# Patient Record
Sex: Female | Born: 1985 | Race: White | Hispanic: No | Marital: Married | Smoking: Never smoker
Health system: Southern US, Community
[De-identification: ages and names within clinical notes are randomized; demographics above are authoritative.]

## PROBLEM LIST (undated history)

## (undated) DIAGNOSIS — J45909 Unspecified asthma, uncomplicated: Secondary | ICD-10-CM

## (undated) HISTORY — DX: Unspecified asthma, uncomplicated: J45.909

---

## 2009-08-03 ENCOUNTER — Ambulatory Visit: Payer: Self-pay | Admitting: Internal Medicine

## 2009-09-06 ENCOUNTER — Ambulatory Visit: Payer: Self-pay | Admitting: Family Medicine

## 2009-09-10 ENCOUNTER — Inpatient Hospital Stay: Payer: Self-pay | Admitting: Internal Medicine

## 2009-10-16 ENCOUNTER — Ambulatory Visit: Payer: Self-pay | Admitting: Internal Medicine

## 2010-04-25 IMAGING — CT CT CHEST W/ CM
1 of 2 series · 14 of 32 positions shown, 18 images · IV contrast (isovue)
Comparison: none

REASON FOR EXAM: shortnes  of breath with  O2 stat 92
COMMENTS:   LMP: negative preg in ER

PROCEDURE:     CT  - CT CHEST (FOR PE) W  - September 10, 2009  [DATE]
RESULT:     Chest CT dated 09/10/2009.
TECHNIQUE: Helical 3 mm sections were obtained from the thoracic inlet through the lung
bases status post intravenous administration of 75 mL Isovue 370.

[Series 5: lung windows · axial · 0.62mm/px · z∈[-308,-44]mm · 14 of 106 slices shown, 18 images]
[im 9/106  mediastinal]
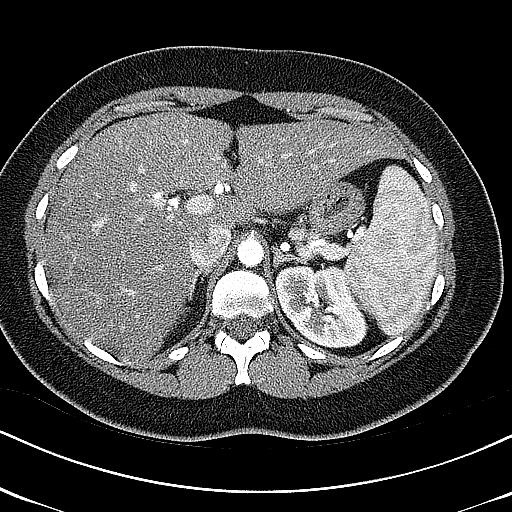
[im 9/106  lung]
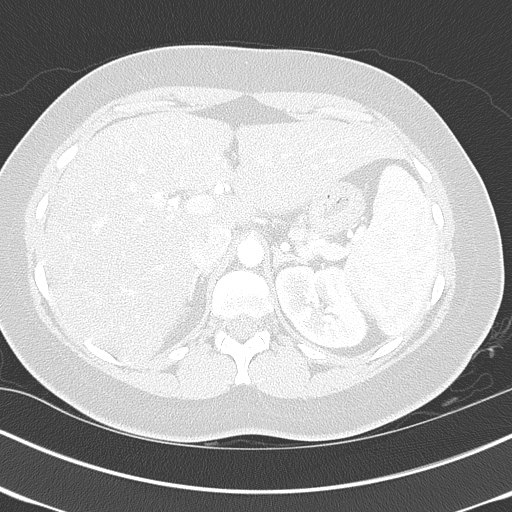
[im 17/106  lung]
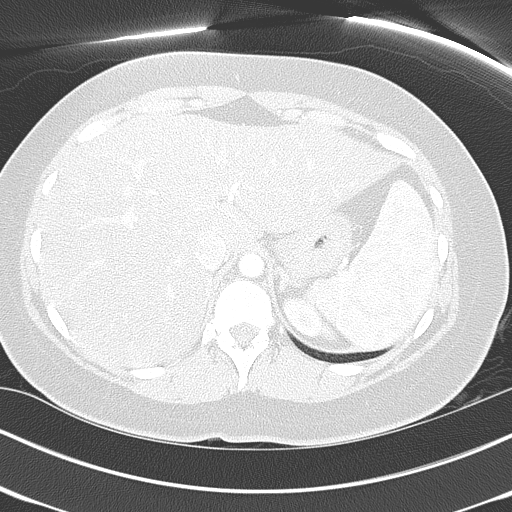
[im 25/106  lung]
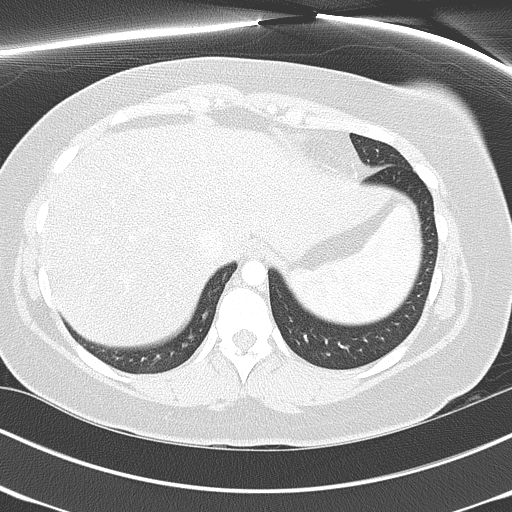
[im 33/106  lung]
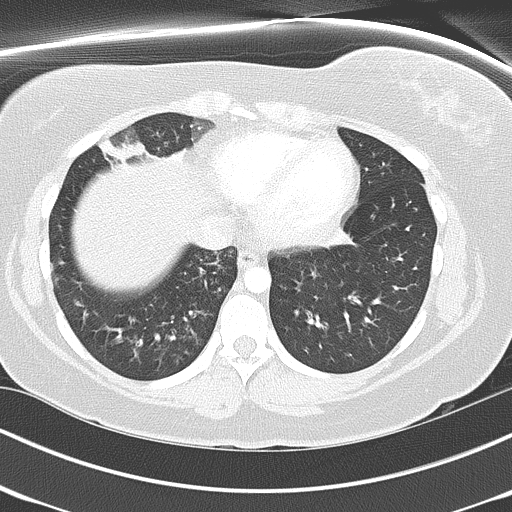
[im 41/106  mediastinal]
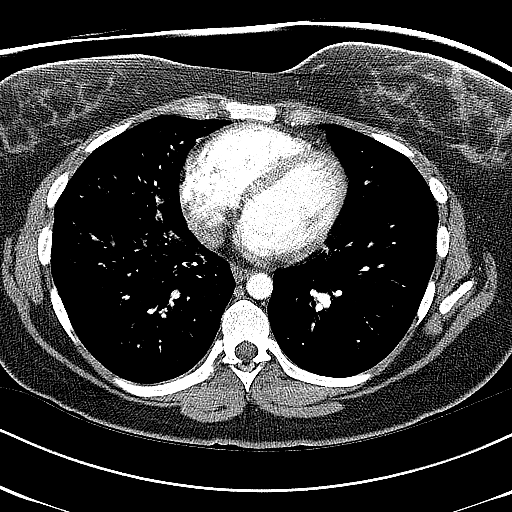
[im 41/106  lung]
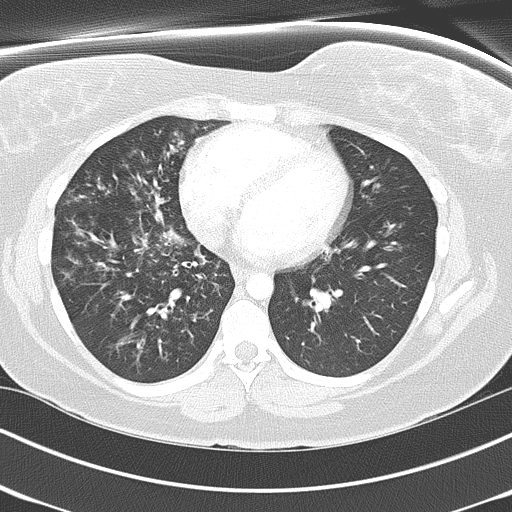
[im 49/106  lung]
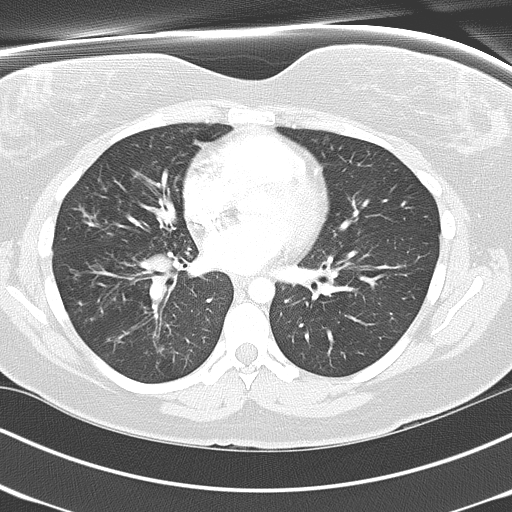
[im 50/106  lung]
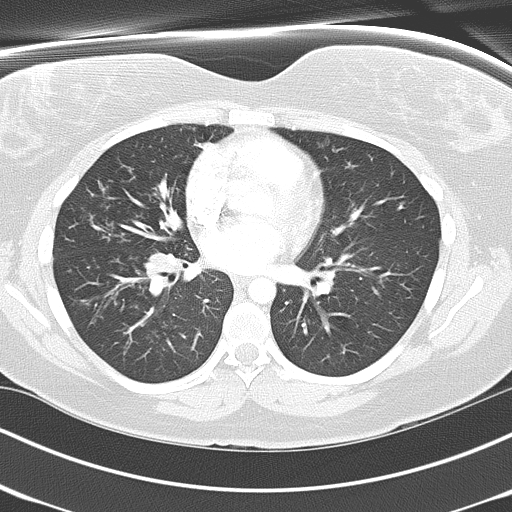
[im 53/106  lung]
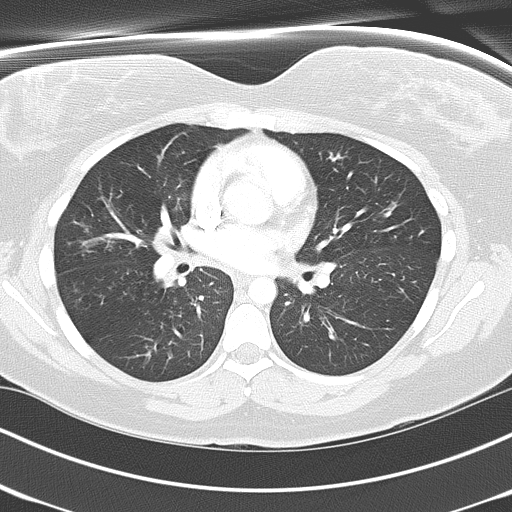
[im 57/106  mediastinal]
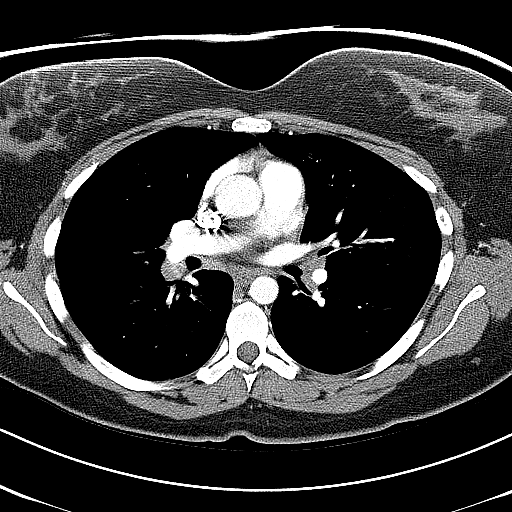
[im 57/106  lung]
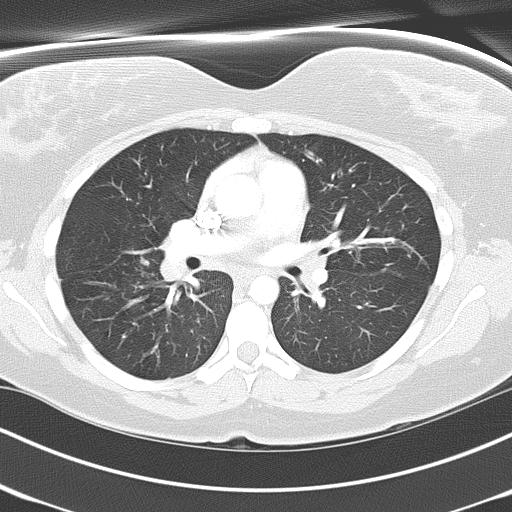
[im 65/106  lung]
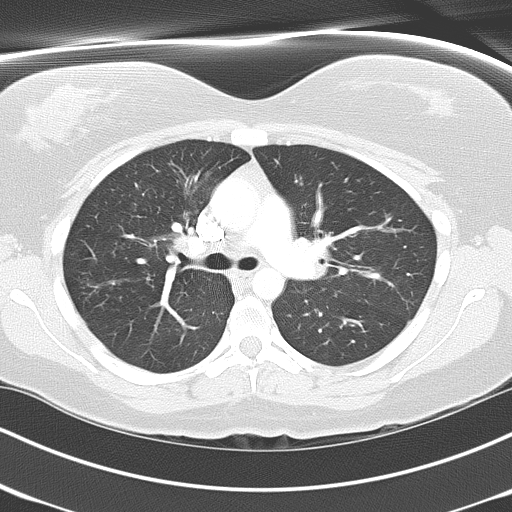
[im 73/106  lung]
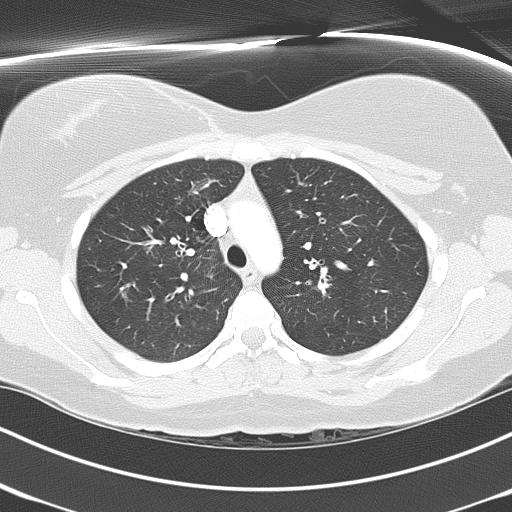
[im 81/106  lung]
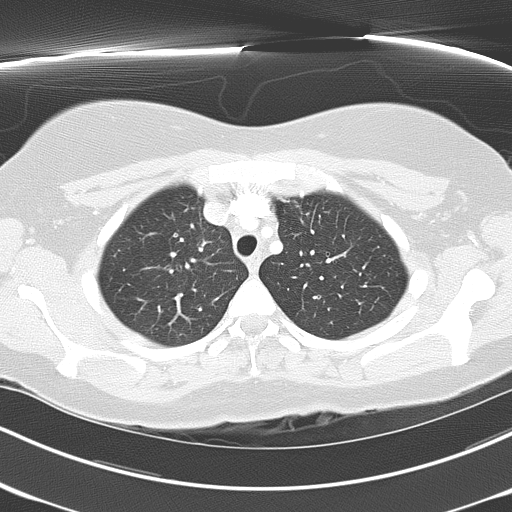
[im 89/106  mediastinal]
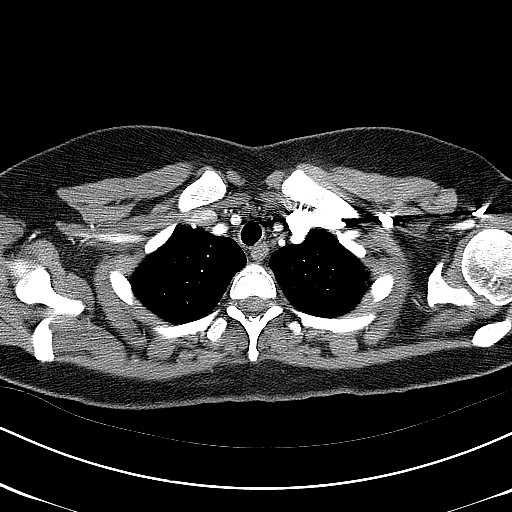
[im 89/106  lung]
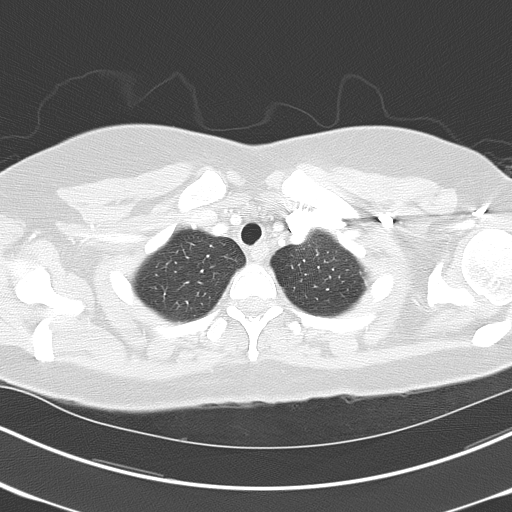
[im 97/106  lung]
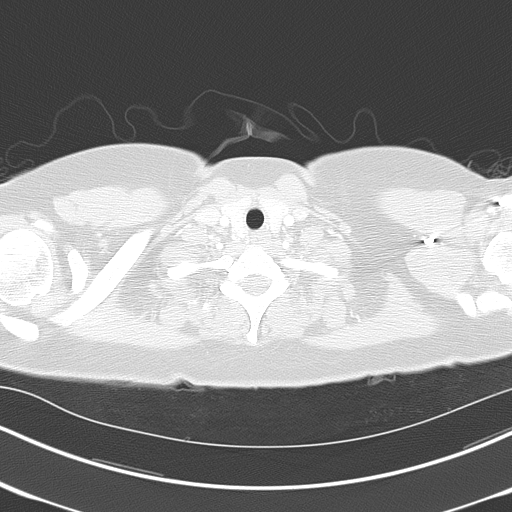

[14 of 32 positions shown; findings below may reference images not displayed]

FINDINGS: Evaluation of the mediastinum demonstrates no evidence of mediastinal
adenopathy nor masses. Bilateral enlarged hilar lymph nodes are identified.
Measuring approximately 1 cm. Is no evidence of hilar masses. Is no evidence
of filling defects within the main lobar or segmental pulmonary arteries.
Evaluation of the lung parenchyma demonstrates patchy areas of multifocal
pulmonary opacities primarily within the right hemithorax. These areas
demonstrated primarily interstitial distribution. A more consolidative
nodular component projects within the anterior base of the right middle
lobe. The appears of the primarily sparing within the left hemithorax
without focal regions of consolidation nor appreciable focal infiltrates.
Evaluation of the visualized upper abdominal viscera demonstrates a focus of
low-attenuation only partially visualized in the region of the porta hepatis
liker attending partial visualization of the gallbladder with averaging of
the overlying liver parenchyma. The remaining visualized upper abdominal
viscera otherwise grossly unremarkable.

There is no evidence of pleural effusions.
IMPRESSION: Primarily interstitial infiltrate involving the right
hemithorax with greatest confluence in the right lower lobe and base of the
middle lobes. There is a minimal alveolar component. Consolidative nodular
components also identified within the base the right middle lobe. Prominent
hilar lymph nodes are identified likely reactive. Different considerations
are infectious infiltrate redoing clinically appropriate. Inflammatory
etiologies cannot be excluded nor of lower different considerations. Ominous
etiologies are of little to no differential consideration though if persist
status post appropriate there acute regiment may be of differential
consideration. Surveillance CT and/or plain film radiograph is recommended
status post appropriate therapeutic regiment.
2. Dr. Tadasa of the emergency department was informed of these findings at
the time of initial interpretation.

## 2010-05-31 IMAGING — CR DG CHEST 2V
1 series · 2 of 2 positions shown · non-contrast
Comparison: none

REASON FOR EXAM: sob cough
COMMENTS:

PROCEDURE:     DXR - DXR CHEST PA (OR AP) AND LATERAL  - October 16, 2009  [DATE]
RESULT:     The previously noted right lower lobe infiltrate has  now
cleared. No new pulmonary infiltrates are identified. Heart size is normal.
Mediastinal and osseous structures are normal in appearance.

[Series 1: view not recorded · 0.17mm/px · 2 of 2 slices shown]
[im 1/2]
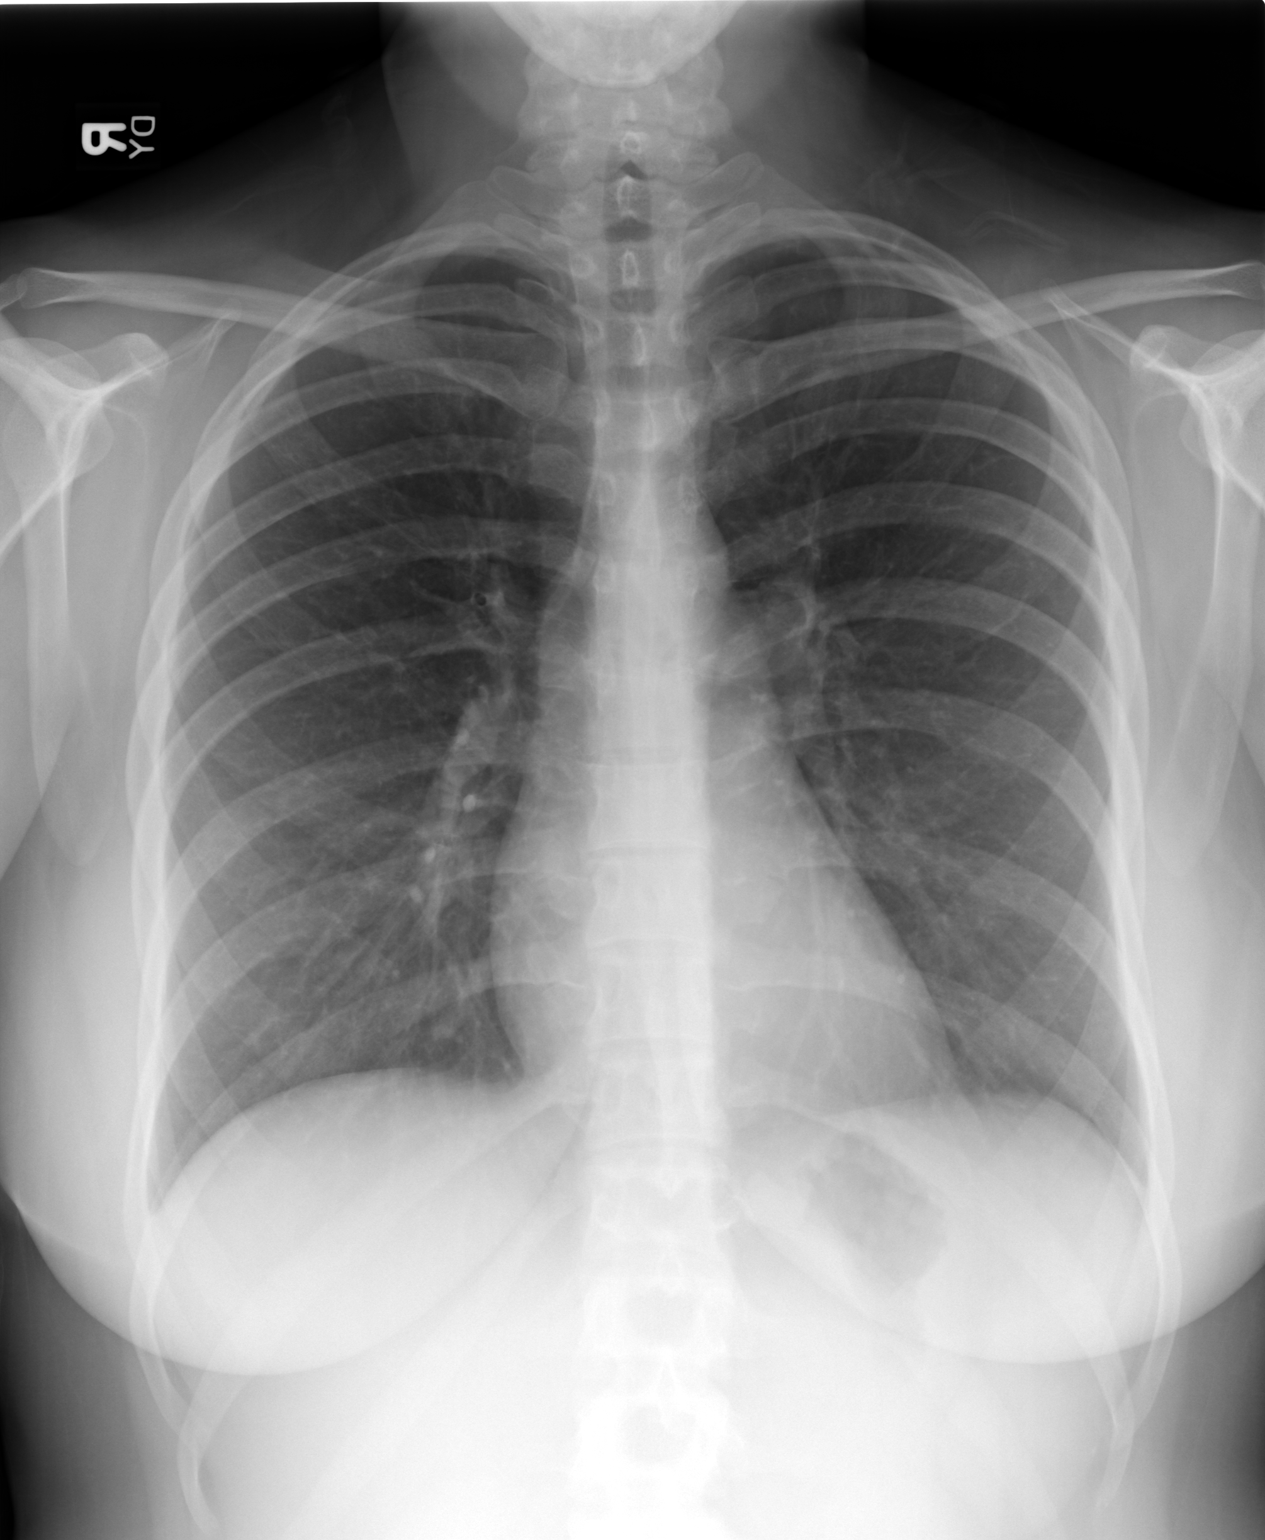
[im 2/2]
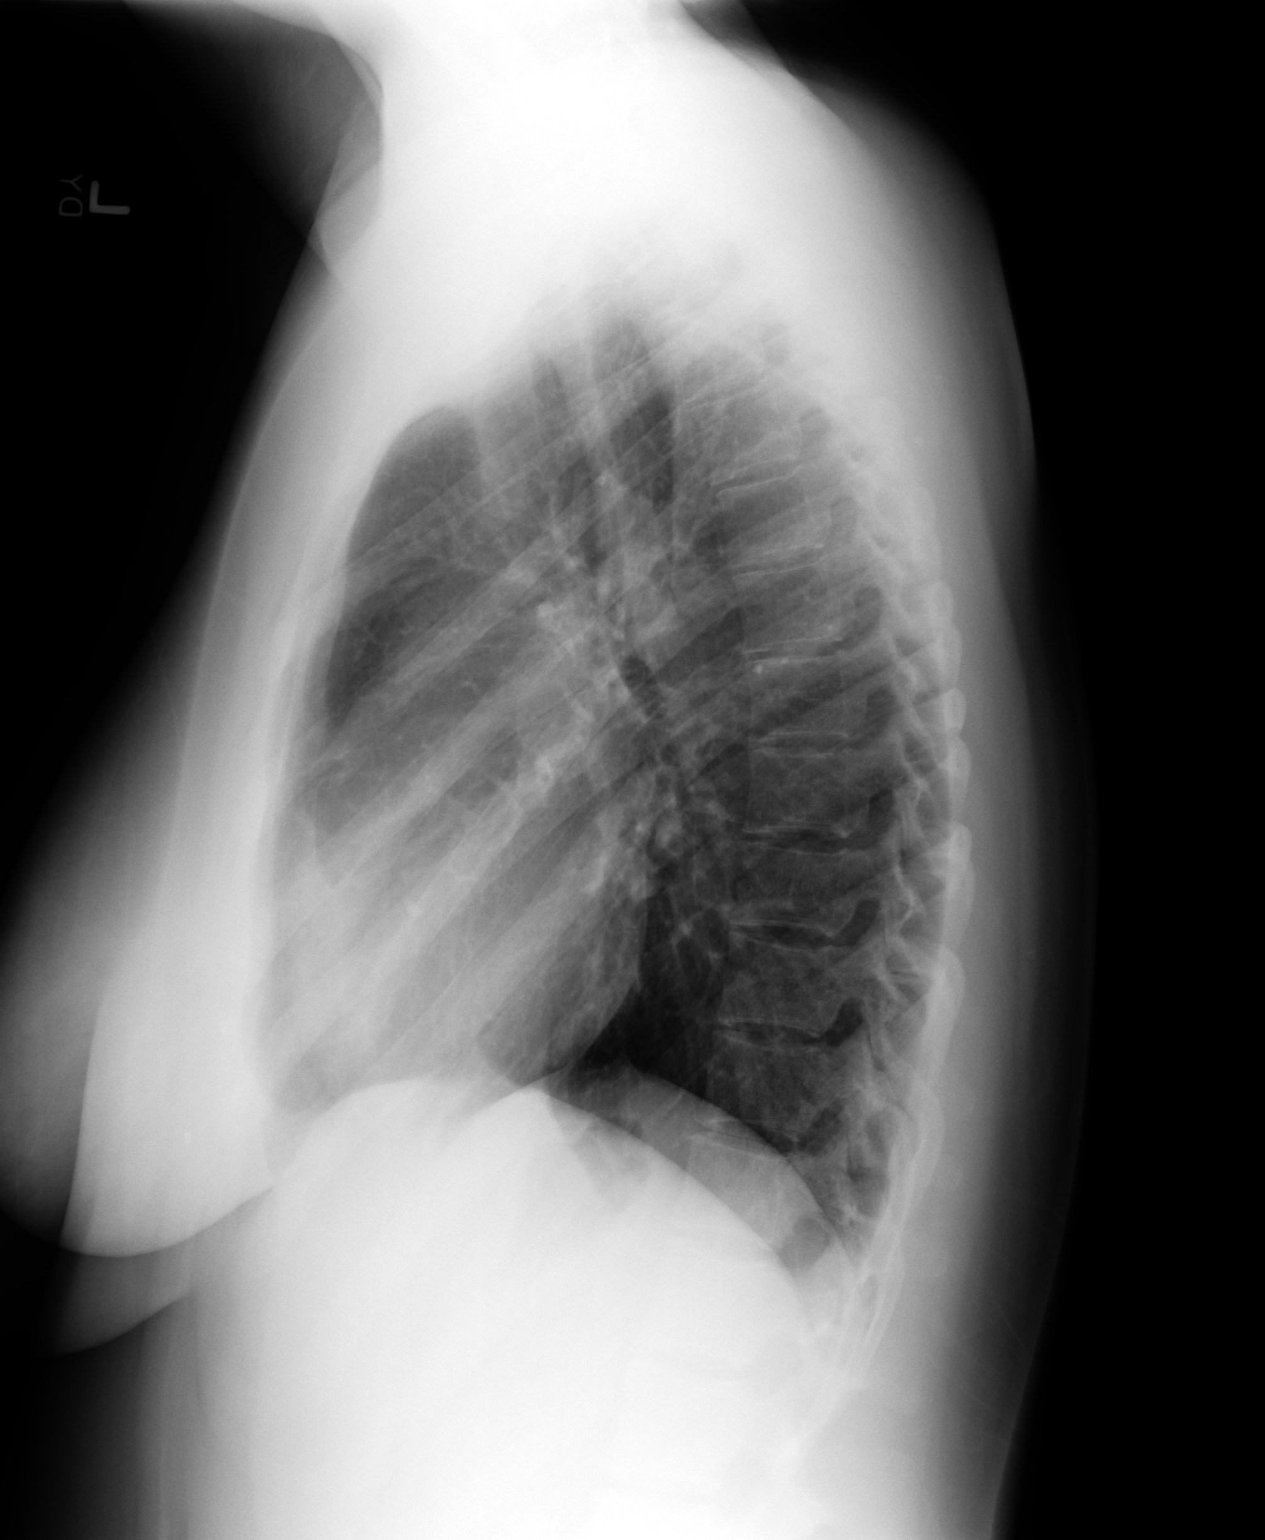

[2 of 2 positions shown; findings below may reference images not displayed]

IMPRESSION: 1.     The lung fields are clear.
2.     The previously noted infiltrate medially in the right lower lobe has
resolved. No new infiltrates are seen.

## 2011-10-03 ENCOUNTER — Ambulatory Visit: Payer: Self-pay

## 2016-07-14 ENCOUNTER — Ambulatory Visit
Admission: EM | Admit: 2016-07-14 | Discharge: 2016-07-14 | Disposition: A | Discharge status: Home / Self Care | Payer: Self-pay | Attending: Emergency Medicine | Admitting: Emergency Medicine

## 2016-07-14 DIAGNOSIS — J101 Influenza due to other identified influenza virus with other respiratory manifestations: Secondary | ICD-10-CM

## 2016-07-14 HISTORY — DX: Unspecified asthma, uncomplicated: J45.909

## 2016-07-14 LAB — RAPID INFLUENZA A&B ANTIGENS (ARMC ONLY): INFLUENZA A (ARMC): POSITIVE — AB

## 2016-07-14 LAB — RAPID INFLUENZA A&B ANTIGENS: Influenza B (ARMC): NEGATIVE

## 2016-07-14 MED ORDER — FLUTICASONE PROPIONATE 50 MCG/ACT NA SUSP: 2.0000 | Freq: Every day | NASAL | 0 refills | Status: DC

## 2016-07-14 MED ORDER — AEROCHAMBER PLUS MISC: 2 refills | Status: DC

## 2016-07-14 MED ORDER — OSELTAMIVIR PHOSPHATE 75 MG PO CAPS: 75.0000 mg | ORAL_CAPSULE | Freq: Two times a day (BID) | ORAL | 0 refills | Status: DC

## 2016-07-14 NOTE — ED Provider Notes (Signed)
HPI  SUBJECTIVE:  Kimberly Valdez is a 31 y.o. female who presents with 2 days of bodyaches for headaches, clear rhinorrhea, and postnasal drip, scratchy sore throat and a cough productive of white phlegm. She reports fevers Tmax 102.6. She reports sinus pain and pressure, decreased appetite but is tolerating by mouth. She tried TheraFlu and ibuprofen 800 mg which helped her symptoms. No aggravating factors. She denies wheezing, chest pain, short of breath. No dental pain, facial swelling. No photophobia, neck stiffness, rash. No abdominal pain, urinary complaint, diarrhea. No skin infections. She did not get a flu shot this year. No known contacts with flu. Sates that she is sleeping well at night. She has a past medical history of asthma for which she has an inhaler. No history of diabetes, hypertension, immunosuppression. LMP: 1.5 weeks ago. Denies possibility of being pregnant. PMD: Dr. Welton Flakes    Past Medical History:  Diagnosis Date  . RAD (reactive airway disease)     History reviewed. No pertinent surgical history.  History reviewed. No pertinent family history.  Social History  Substance Use Topics  . Smoking status: Never Smoker  . Smokeless tobacco: Never Used  . Alcohol use Yes     Comment: rare    No current facility-administered medications for this encounter.   Current Outpatient Prescriptions:  .  albuterol (PROVENTIL HFA;VENTOLIN HFA) 108 (90 Base) MCG/ACT inhaler, Inhale into the lungs every 6 (six) hours as needed for wheezing or shortness of breath., Disp: , Rfl:  .  fluticasone (FLONASE) 50 MCG/ACT nasal spray, Place 2 sprays into both nostrils daily., Disp: 16 g, Rfl: 0 .  oseltamivir (TAMIFLU) 75 MG capsule, Take 1 capsule (75 mg total) by mouth 2 (two) times daily. X 5 days, Disp: 10 capsule, Rfl: 0 .  Spacer/Aero-Holding Chambers (AEROCHAMBER PLUS) inhaler, Use as instructed, Disp: 1 each, Rfl: 2  No Known Allergies   ROS  As noted in HPI.   Physical  Exam  BP 118/82 (BP Location: Left Arm)   Pulse 86   Temp 98.8 F (37.1 C) (Oral)   Resp 20   Ht 5\' 4"  (1.626 m)   Wt 221 lb (100.2 kg)   LMP 07/05/2016   SpO2 97%   BMI 37.93 kg/m   Constitutional: Well developed, well nourished, no acute distress Eyes: PERRL, EOMI, conjunctiva normal bilaterally HENT: Normocephalic, atraumatic,mucus membranes moist. + clear nasal congestion, erythematous, swollen turbinates. No sinus tenderness. Normal tonsils, uvula midline. Unable to visualize oropharynx completely. Neck: No cervical lymphadenopathy, meningismus  Respiratory: Clear to auscultation bilaterally, no rales, no wheezing, no rhonchi Cardiovascular: Normal rate and rhythm, no murmurs, no gallops, no rubs GI: Soft, nondistended, normal bowel sounds, nontender, no rebound, no guarding Back: no CVAT skin: No rash, skin intact Musculoskeletal: No edema, no tenderness, no deformities Neurologic: Alert & oriented x 3, CN II-XII grossly intact, no motor deficits, sensation grossly intact Psychiatric: Speech and behavior appropriate   ED Course   Medications - No data to display  Orders Placed This Encounter  Procedures  . Rapid Influenza A&B Antigens ([facility] only)    Standing Status:   Standing    Number of Occurrences:   1  . Droplet precaution    Standing Status:   Standing    Number of Occurrences:   1   Results for orders placed or performed during the hospital encounter of 07/14/16 (from the past 24 hour(s))  Rapid Influenza A&B Antigens ([facility] only)     Status: Abnormal  Collection Time: 07/14/16 11:43 AM  Result Value Ref Range   Influenza A ([facility]) POSITIVE (A) NEGATIVE   Influenza B ([facility]) NEGATIVE NEGATIVE   No results found.  ED Clinical Impression  Influenza A   ED Assessment/Plan  She influenza A positive. Plan to send home with Tamiflu, ibuprofen 800 mg 1 g of Tylenol 3 times a day Patient states that she is plenty of ibuprofen 800 mg at home.. Advised  Flonase, saline nasal irrigation, Mucinex D, discontinue all other cold medicines. We'll also prescribe a spacer for her albuterol inhaler she states that she does not have one. Push electrolyte containing fluids. Work note for 2 days. Follow-up with primary care physician as needed, to the ER if gets worse   Discussed labs, MDM, plan and followup with patient  Discussed sn/sx that should prompt return to the ED. Patient  agrees with plan.   Meds ordered this encounter  Medications  . albuterol (PROVENTIL HFA;VENTOLIN HFA) 108 (90 Base) MCG/ACT inhaler    Sig: Inhale into the lungs every 6 (six) hours as needed for wheezing or shortness of breath.  . fluticasone (FLONASE) 50 MCG/ACT nasal spray    Sig: Place 2 sprays into both nostrils daily.    Dispense:  16 g    Refill:  0  . Spacer/Aero-Holding Chambers (AEROCHAMBER PLUS) inhaler    Sig: Use as instructed    Dispense:  1 each    Refill:  2  . oseltamivir (TAMIFLU) 75 MG capsule    Sig: Take 1 capsule (75 mg total) by mouth 2 (two) times daily. X 5 days    Dispense:  10 capsule    Refill:  0    *This clinic note was created using Scientist, clinical (histocompatibility and immunogenetics)Dragon dictation software. Therefore, there may be occasional mistakes despite careful proofreading.  ?   Domenick GongAshley Johnnell Liou, MD 07/14/16 [phone]

## 2016-07-14 NOTE — Discharge Instructions (Signed)
ibuprofen 800 mg 1 g of Tylenol 3 times a day  Flonase, saline nasal irrigation, Mucinex D 1200 mg of guaifenesin with 120 mg pseudoephedrine, discontinue all other cold medicines. We'll also prescribe a spacer for her albuterol inhaler she states that she does not have one. Push electrolyte containing fluids.

## 2016-07-14 NOTE — ED Triage Notes (Signed)
Pt with fever 102, cough, bodyaches since Friday night.

## 2017-01-27 DIAGNOSIS — G43909 Migraine, unspecified, not intractable, without status migrainosus: Secondary | ICD-10-CM | POA: Insufficient documentation

## 2017-01-27 LAB — HM PAP SMEAR: HM Pap smear: NEGATIVE

## 2018-09-17 ENCOUNTER — Ambulatory Visit: Payer: Self-pay

## 2018-09-22 ENCOUNTER — Ambulatory Visit: Payer: Self-pay | Admitting: Gerontology

## 2018-09-23 ENCOUNTER — Encounter: Payer: Self-pay | Admitting: Gerontology

## 2018-09-23 ENCOUNTER — Ambulatory Visit: Payer: Self-pay | Admitting: Gerontology

## 2018-09-23 ENCOUNTER — Encounter (INDEPENDENT_AMBULATORY_CARE_PROVIDER_SITE_OTHER): Payer: Self-pay

## 2018-09-23 ENCOUNTER — Other Ambulatory Visit: Payer: Self-pay

## 2018-09-23 VITALS — BP 132/96 | HR 82 | Ht 64.0 in | Wt 236.3 lb

## 2018-09-23 DIAGNOSIS — Z7689 Persons encountering health services in other specified circumstances: Secondary | ICD-10-CM

## 2018-09-23 DIAGNOSIS — Z8709 Personal history of other diseases of the respiratory system: Secondary | ICD-10-CM

## 2018-09-23 DIAGNOSIS — Z8669 Personal history of other diseases of the nervous system and sense organs: Secondary | ICD-10-CM

## 2018-09-23 MED ORDER — MOMETASONE FURO-FORMOTEROL FUM 100-5 MCG/ACT IN AERO: 2.0000 | INHALATION_SPRAY | Freq: Two times a day (BID) | RESPIRATORY_TRACT | 2 refills | Status: DC

## 2018-09-23 MED ORDER — ALBUTEROL SULFATE HFA 108 (90 BASE) MCG/ACT IN AERS: 2.0000 | INHALATION_SPRAY | Freq: Four times a day (QID) | RESPIRATORY_TRACT | 3 refills | Status: DC | PRN

## 2018-09-23 NOTE — Progress Notes (Signed)
Patient ID: NOELA BROTHERS, female   DOB: 1986/04/19, 32 y.o.   MRN: [id number]  No chief complaint on file.   HPI OLUWATOSIN BRACY is a 33 y.o. female. Presents to establish care and evaluation of Asthma and Migraine Asthma;  She states that she has Asthma which flares up during the allergy season and cold weather. She reports that last month she had URI and was prescribed Albuterol and prednisone taper, and information was not found. She states that she takes otc 10 mg claritine daily. She reports  that in the past week, she has being using her Albuterol inhaler 1 puff every 2-3 hours because she was experiencing intermittent chest tightness, wheezing, and shortness of breath many times during the day. She denies cough, fever, chills,chest pain and palpitation.  Migraine: She states that she experiences mild intermittent migraine headache to right temporal side of her head that starts 3 days prior to her menstrual cycle , and taking 200 mg of Excedrine relieves headache. She reports that her migraine usually resolves within 48 - 72 hours.She denies photophobia or phonophobia. Otherwise she denies further concerns.  Past Medical History:  Diagnosis Date  . RAD (reactive airway disease)     No past surgical history on file.  No family history on file.  Social History Social History   Tobacco Use  . Smoking status: Never Smoker  . Smokeless tobacco: Never Used  Substance Use Topics  . Alcohol use: Yes    Comment: rare  . Drug use: No    No Known Allergies  Current Outpatient Medications  Medication Sig Dispense Refill  . albuterol (PROVENTIL HFA;VENTOLIN HFA) 108 (90 Base) MCG/ACT inhaler Inhale into the lungs every 6 (six) hours as needed for wheezing or shortness of breath.    . fluticasone (FLONASE) 50 MCG/ACT nasal spray Place 2 sprays into both nostrils daily. 16 g 0  . oseltamivir (TAMIFLU) 75 MG capsule Take 1 capsule (75 mg total) by mouth 2 (two) times daily. X 5 days 10  capsule 0  . Spacer/Aero-Holding Chambers (AEROCHAMBER PLUS) inhaler Use as instructed 1 each 2   No current facility-administered medications for this visit.     Review of Systems Review of Systems  Constitutional: Negative.   HENT: Negative.   Eyes: Negative.   Respiratory: Positive for chest tightness, shortness of breath and wheezing. Negative for cough.   Cardiovascular: Negative.   Gastrointestinal: Negative.   Genitourinary: Negative.   Musculoskeletal: Negative.   Skin: Negative.   Neurological: Negative.   Psychiatric/Behavioral: Negative.     There were no vitals taken for this visit.  Physical Exam Physical Exam Constitutional:      Appearance: Normal appearance.  HENT:     Head: Normocephalic and atraumatic.     Nose: Nose normal.     Mouth/Throat:     Mouth: Mucous membranes are moist.  Neck:     Musculoskeletal: Normal range of motion.  Cardiovascular:     Rate and Rhythm: Normal rate and regular rhythm.  Pulmonary:     Effort: Pulmonary effort is normal.     Breath sounds: Examination of the right-lower field reveals wheezing. Examination of the left-lower field reveals wheezing. Wheezing present.  Abdominal:     General: Bowel sounds are normal. Distention: central obesity.     Palpations: Abdomen is soft.  Musculoskeletal: Normal range of motion.  Skin:    General: Skin is warm.  Neurological:     General: No focal deficit present.  Mental Status: She is alert and oriented to person, place, and time. Mental status is at baseline.     Data Reviewed Past medical history and routine labs was ordered.  Assessment and plan  1. Encounter to establish care - Routine labs collected after visit. - CBC w/Diff - Comp Met (CMET) - Lipid panel; Future - HgB A1c - Urinalysis; Future - TSH; Future   2. History of asthma - Moderate Persistent Asthma, will continue on Low ICS /LABA and Albuterol as needed. - She was advised on medication side  effects and advised to notify provider. - albuterol (PROVENTIL HFA;VENTOLIN HFA) 108 (90 Base) MCG/ACT inhaler; Inhale 2 puffs into the lungs every 6 (six) hours as needed for wheezing or shortness of breath.  Dispense: 1 Inhaler; Refill: 3 - mometasone-formoterol (DULERA) 100-5 MCG/ACT AERO; Inhale 2 puffs into the lungs 2 (two) times daily.  Dispense: 1 Inhaler; Refill: 2  3. History of migraine - Well controlled, continue on current treatment regimen.   Follow up in 6 weeks or if symptom worsens.    Torii Royse E Braidyn Peace 09/23/2018, 11:03 AM

## 2018-09-23 NOTE — Patient Instructions (Signed)
DASH Eating Plan  DASH stands for "Dietary Approaches to Stop Hypertension." The DASH eating plan is a healthy eating plan that has been shown to reduce high blood pressure (hypertension). It may also reduce your risk for type 2 diabetes, heart disease, and stroke. The DASH eating plan may also help with weight loss.  What are tips for following this plan?    General guidelines   Avoid eating more than 2,300 mg (milligrams) of salt (sodium) a day. If you have hypertension, you may need to reduce your sodium intake to 1,500 mg a day.   Limit alcohol intake to no more than 1 drink a day for nonpregnant women and 2 drinks a day for men. One drink equals 12 oz of beer, 5 oz of wine, or 1 oz of hard liquor.   Work with your health care provider to maintain a healthy body weight or to lose weight. Ask what an ideal weight is for you.   Get at least 30 minutes of exercise that causes your heart to beat faster (aerobic exercise) most days of the week. Activities may include walking, swimming, or biking.   Work with your health care provider or diet and nutrition specialist (dietitian) to adjust your eating plan to your individual calorie needs.  Reading food labels     Check food labels for the amount of sodium per serving. Choose foods with less than 5 percent of the Daily Value of sodium. Generally, foods with less than 300 mg of sodium per serving fit into this eating plan.   To find whole grains, look for the word "whole" as the first word in the ingredient list.  Shopping   Buy products labeled as "low-sodium" or "no salt added."   Buy fresh foods. Avoid canned foods and premade or frozen meals.  Cooking   Avoid adding salt when cooking. Use salt-free seasonings or herbs instead of table salt or sea salt. Check with your health care provider or pharmacist before using salt substitutes.   Do not fry foods. Cook foods using healthy methods such as baking, boiling, grilling, and broiling instead.   Cook with  heart-healthy oils, such as olive, canola, soybean, or sunflower oil.  Meal planning   Eat a balanced diet that includes:  ? 5 or more servings of fruits and vegetables each day. At each meal, try to fill half of your plate with fruits and vegetables.  ? Up to 6-8 servings of whole grains each day.  ? Less than 6 oz of lean meat, poultry, or fish each day. A 3-oz serving of meat is about the same size as a deck of cards. One egg equals 1 oz.  ? 2 servings of low-fat dairy each day.  ? A serving of nuts, seeds, or beans 5 times each week.  ? Heart-healthy fats. Healthy fats called Omega-3 fatty acids are found in foods such as flaxseeds and coldwater fish, like sardines, salmon, and mackerel.   Limit how much you eat of the following:  ? Canned or prepackaged foods.  ? Food that is high in trans fat, such as fried foods.  ? Food that is high in saturated fat, such as fatty meat.  ? Sweets, desserts, sugary drinks, and other foods with added sugar.  ? Full-fat dairy products.   Do not salt foods before eating.   Try to eat at least 2 vegetarian meals each week.   Eat more home-cooked food and less restaurant, buffet, and fast food.     When eating at a restaurant, ask that your food be prepared with less salt or no salt, if possible.  What foods are recommended?  The items listed may not be a complete list. Talk with your dietitian about what dietary choices are best for you.  Grains  Whole-grain or whole-wheat bread. Whole-grain or whole-wheat pasta. Brown rice. Oatmeal. Quinoa. Bulgur. Whole-grain and low-sodium cereals. Pita bread. Low-fat, low-sodium crackers. Whole-wheat flour tortillas.  Vegetables  Fresh or frozen vegetables (raw, steamed, roasted, or grilled). Low-sodium or reduced-sodium tomato and vegetable juice. Low-sodium or reduced-sodium tomato sauce and tomato paste. Low-sodium or reduced-sodium canned vegetables.  Fruits  All fresh, dried, or frozen fruit. Canned fruit in natural juice (without  added sugar).  Meat and other protein foods  Skinless chicken or turkey. Ground chicken or turkey. Pork with fat trimmed off. Fish and seafood. Egg whites. Dried beans, peas, or lentils. Unsalted nuts, nut butters, and seeds. Unsalted canned beans. Lean cuts of beef with fat trimmed off. Low-sodium, lean deli meat.  Dairy  Low-fat (1%) or fat-free (skim) milk. Fat-free, low-fat, or reduced-fat cheeses. Nonfat, low-sodium ricotta or cottage cheese. Low-fat or nonfat yogurt. Low-fat, low-sodium cheese.  Fats and oils  Soft margarine without trans fats. Vegetable oil. Low-fat, reduced-fat, or light mayonnaise and salad dressings (reduced-sodium). Canola, safflower, olive, soybean, and sunflower oils. Avocado.  Seasoning and other foods  Herbs. Spices. Seasoning mixes without salt. Unsalted popcorn and pretzels. Fat-free sweets.  What foods are not recommended?  The items listed may not be a complete list. Talk with your dietitian about what dietary choices are best for you.  Grains  Baked goods made with fat, such as croissants, muffins, or some breads. Dry pasta or rice meal packs.  Vegetables  Creamed or fried vegetables. Vegetables in a cheese sauce. Regular canned vegetables (not low-sodium or reduced-sodium). Regular canned tomato sauce and paste (not low-sodium or reduced-sodium). Regular tomato and vegetable juice (not low-sodium or reduced-sodium). Pickles. Olives.  Fruits  Canned fruit in a light or heavy syrup. Fried fruit. Fruit in cream or butter sauce.  Meat and other protein foods  Fatty cuts of meat. Ribs. Fried meat. Bacon. Sausage. Bologna and other processed lunch meats. Salami. Fatback. Hotdogs. Bratwurst. Salted nuts and seeds. Canned beans with added salt. Canned or smoked fish. Whole eggs or egg yolks. Chicken or turkey with skin.  Dairy  Whole or 2% milk, cream, and half-and-half. Whole or full-fat cream cheese. Whole-fat or sweetened yogurt. Full-fat cheese. Nondairy creamers. Whipped toppings.  Processed cheese and cheese spreads.  Fats and oils  Butter. Stick margarine. Lard. Shortening. Ghee. Bacon fat. Tropical oils, such as coconut, palm kernel, or palm oil.  Seasoning and other foods  Salted popcorn and pretzels. Onion salt, garlic salt, seasoned salt, table salt, and sea salt. Worcestershire sauce. Tartar sauce. Barbecue sauce. Teriyaki sauce. Soy sauce, including reduced-sodium. Steak sauce. Canned and packaged gravies. Fish sauce. Oyster sauce. Cocktail sauce. Horseradish that you find on the shelf. Ketchup. Mustard. Meat flavorings and tenderizers. Bouillon cubes. Hot sauce and Tabasco sauce. Premade or packaged marinades. Premade or packaged taco seasonings. Relishes. Regular salad dressings.  Where to find more information:   National Heart, Lung, and Blood Institute: www.nhlbi.nih.gov   American Heart Association: www.heart.org  Summary   The DASH eating plan is a healthy eating plan that has been shown to reduce high blood pressure (hypertension). It may also reduce your risk for type 2 diabetes, heart disease, and stroke.   With the   DASH eating plan, you should limit salt (sodium) intake to 2,300 mg a day. If you have hypertension, you may need to reduce your sodium intake to 1,500 mg a day.   When on the DASH eating plan, aim to eat more fresh fruits and vegetables, whole grains, lean proteins, low-fat dairy, and heart-healthy fats.   Work with your health care provider or diet and nutrition specialist (dietitian) to adjust your eating plan to your individual calorie needs.  This information is not intended to replace advice given to you by your health care provider. Make sure you discuss any questions you have with your health care provider.  Document Released: 06/06/2011 Document Revised: 06/10/2016 Document Reviewed: 06/10/2016  Elsevier Interactive Patient Education  2019 [employer]

## 2018-09-24 LAB — COMPREHENSIVE METABOLIC PANEL
A/G RATIO: 1.7 (ref 1.2–2.2)
ALT: 43 IU/L — AB (ref 0–32)
AST: 31 IU/L (ref 0–40)
Albumin: 4.3 g/dL (ref 3.8–4.8)
Alkaline Phosphatase: 62 IU/L (ref 39–117)
BILIRUBIN TOTAL: 0.3 mg/dL (ref 0.0–1.2)
BUN/Creatinine Ratio: 17 (ref 9–23)
BUN: 12 mg/dL (ref 6–20)
CALCIUM: 9.4 mg/dL (ref 8.7–10.2)
CHLORIDE: 100 mmol/L (ref 96–106)
CO2: 24 mmol/L (ref 20–29)
Creatinine, Ser: 0.72 mg/dL (ref 0.57–1.00)
GFR calc non Af Amer: 110 mL/min/{1.73_m2} (ref >59)
GFR, EST AFRICAN AMERICAN: 127 mL/min/{1.73_m2} (ref >59)
Globulin, Total: 2.6 g/dL (ref 1.5–4.5)
Glucose: 80 mg/dL (ref 65–99)
POTASSIUM: 4.6 mmol/L (ref 3.5–5.2)
Sodium: 140 mmol/L (ref 134–144)
TOTAL PROTEIN: 6.9 g/dL (ref 6.0–8.5)

## 2018-09-24 LAB — HEMOGLOBIN A1C
ESTIMATED AVERAGE GLUCOSE: 105 mg/dL
Hgb A1c MFr Bld: 5.3 % (ref 4.8–5.6)

## 2018-09-24 LAB — CBC WITH DIFFERENTIAL/PLATELET
Basophils Absolute: 0.2 10*3/uL (ref 0.0–0.2)
Basos: 2 %
EOS (ABSOLUTE): 1.2 10*3/uL — AB (ref 0.0–0.4)
Eos: 14 %
Hematocrit: 42.7 % (ref 34.0–46.6)
Hemoglobin: 15 g/dL (ref 11.1–15.9)
IMMATURE GRANS (ABS): 0 10*3/uL (ref 0.0–0.1)
IMMATURE GRANULOCYTES: 1 %
LYMPHS: 22 %
Lymphocytes Absolute: 1.8 10*3/uL (ref 0.7–3.1)
MCH: 29.3 pg (ref 26.6–33.0)
MCHC: 35.1 g/dL (ref 31.5–35.7)
MCV: 83 fL (ref 79–97)
Monocytes Absolute: 0.9 10*3/uL (ref 0.1–0.9)
Monocytes: 11 %
NEUTROS PCT: 50 %
Neutrophils Absolute: 4.2 10*3/uL (ref 1.4–7.0)
PLATELETS: 312 10*3/uL (ref 150–450)
RBC: 5.12 x10E6/uL (ref 3.77–5.28)
RDW: 12.7 % (ref 11.7–15.4)
WBC: 8.3 10*3/uL (ref 3.4–10.8)

## 2018-09-24 LAB — URINALYSIS
Bilirubin, UA: NEGATIVE
Glucose, UA: NEGATIVE
Ketones, UA: NEGATIVE
Leukocytes, UA: NEGATIVE
Nitrite, UA: NEGATIVE
PH UA: 5.5 (ref 5.0–7.5)
PROTEIN UA: NEGATIVE
RBC, UA: NEGATIVE
Specific Gravity, UA: 1.016 (ref 1.005–1.030)
Urobilinogen, Ur: 0.2 mg/dL (ref 0.2–1.0)

## 2018-09-24 LAB — LIPID PANEL
CHOL/HDL RATIO: 4.3 ratio (ref 0.0–4.4)
CHOLESTEROL TOTAL: 167 mg/dL (ref 100–199)
HDL: 39 mg/dL — ABNORMAL LOW (ref >39)
LDL CALC: 72 mg/dL (ref 0–99)
Triglycerides: 278 mg/dL — ABNORMAL HIGH (ref 0–149)
VLDL CHOLESTEROL CAL: 56 mg/dL — AB (ref 5–40)

## 2018-09-24 LAB — TSH: TSH: 3.9 u[IU]/mL (ref 0.450–4.500)

## 2018-09-25 ENCOUNTER — Other Ambulatory Visit: Payer: Self-pay

## 2018-09-25 ENCOUNTER — Ambulatory Visit: Payer: Self-pay | Admitting: Pharmacy Technician

## 2018-09-25 DIAGNOSIS — Z79899 Other long term (current) drug therapy: Secondary | ICD-10-CM

## 2018-09-25 NOTE — Progress Notes (Signed)
Conducted phone consult.  Patient stated that she has been approved for full Medicaid effective 10/30/18.  Told patient that [facility] would provide medication assistance until her Medicaid became effective.  Sherilyn Dacosta Care Manager [facility]

## 2018-09-28 ENCOUNTER — Other Ambulatory Visit: Payer: Self-pay

## 2018-09-30 HISTORY — PX: DENTAL SURGERY: SHX609

## 2018-10-14 ENCOUNTER — Telehealth: Payer: Self-pay | Admitting: Pharmacy Technician

## 2018-10-14 NOTE — Telephone Encounter (Signed)
Received 2020 proof of income.  Patient eligible to receive medication assistance at [facility] as long as eligibility requirements continue to be met.  [place] [facility]

## 2018-10-15 ENCOUNTER — Telehealth: Payer: Self-pay | Admitting: Pharmacist

## 2018-10-15 NOTE — Telephone Encounter (Signed)
10/15/2018 11:05:20 AM - Ventolin HFA  10/15/2018 Received pharmacy printout for Ventolin HFA Inhale 2 puffs into the lungs every 6 hours as needed for shortness of breath or wheezing, this replaces Albuterol HFA. I have printed [employer] application & script--mailing patient her portion to sign & return, also I will take provider portion to [facility] for provider to sign.AJ   10/15/2018 11:03:29 AM - Elwin Sleight 100/5 mcg  10/15/2018 Received pharmacy printout for Dulera 100/5 mcg Inhale 2 puffs into the lungs two times a day. Printed [employer] application--mailing patient her portion to sign & return, also I will take provider portion to [facility] for provider to sign.[name]

## 2018-11-11 ENCOUNTER — Telehealth: Payer: Self-pay | Admitting: Pharmacist

## 2018-11-11 NOTE — Telephone Encounter (Signed)
11/11/2018 9:17:50 AM - Kimberly Valdez  11/11/2018 Mailed [employer] application for [employer] 100/2mcg Inhale 2 puffs two times a day.AJ   11/11/2018 9:17:03 AM - Ventolin HFA  11/11/2018 Faxed [employer] application for Ventolin HFA Inhale 2 puffs into the lungs every 6 hours as needed for wheezing or shortness of breath.[name]

## 2019-03-04 ENCOUNTER — Telehealth: Payer: Self-pay | Admitting: Pharmacy Technician

## 2019-03-04 NOTE — Telephone Encounter (Signed)
Patient provided documentation that she does not have healthcare coverage.  Approved as long as eligibility criteria continues to be met.  [facility]

## 2019-03-15 ENCOUNTER — Telehealth: Payer: Self-pay | Admitting: Pharmacist

## 2019-03-15 NOTE — Telephone Encounter (Signed)
03/15/2019 3:36:15 PM - Ruthe Mannan refill-call to [employer]  03/15/2019 Called [employer] spoke with Bethena Roys to place refill for [facility] ship 03/22/2019, allow 7-10 business days from that date to receive-shipping to our office.Delos Haring

## 2019-03-29 ENCOUNTER — Telehealth: Payer: Self-pay | Admitting: Pharmacist

## 2019-03-29 NOTE — Telephone Encounter (Signed)
03/29/2019 2:29:26 PM - Ventolin HFA refilled online  03/29/2019 Placed refill online with [facility] for Ventolin HFA, to ship 05/04/2019, Order# [id number].Delos Haring

## 2019-11-30 ENCOUNTER — Other Ambulatory Visit: Payer: Self-pay

## 2019-11-30 ENCOUNTER — Ambulatory Visit (LOCAL_COMMUNITY_HEALTH_CENTER): Payer: Self-pay

## 2019-11-30 DIAGNOSIS — Z23 Encounter for immunization: Secondary | ICD-10-CM

## 2019-11-30 NOTE — Progress Notes (Signed)
Hep B given; tolerated well Richmond Campbell, RN

## 2019-12-02 ENCOUNTER — Ambulatory Visit: Payer: Medicaid Other

## 2020-01-06 ENCOUNTER — Encounter: Payer: Self-pay | Admitting: Advanced Practice Midwife

## 2020-01-06 ENCOUNTER — Other Ambulatory Visit: Payer: Self-pay

## 2020-01-06 ENCOUNTER — Ambulatory Visit (LOCAL_COMMUNITY_HEALTH_CENTER): Payer: Medicaid Other

## 2020-01-06 ENCOUNTER — Ambulatory Visit (LOCAL_COMMUNITY_HEALTH_CENTER): Payer: Medicaid Other | Admitting: Advanced Practice Midwife

## 2020-01-06 VITALS — BP 135/88 | HR 87 | Temp 97.5°F | Resp 12 | Ht 64.0 in | Wt 243.4 lb

## 2020-01-06 DIAGNOSIS — Z3009 Encounter for other general counseling and advice on contraception: Secondary | ICD-10-CM | POA: Diagnosis not present

## 2020-01-06 DIAGNOSIS — Z01419 Encounter for gynecological examination (general) (routine) without abnormal findings: Secondary | ICD-10-CM

## 2020-01-06 DIAGNOSIS — Z23 Encounter for immunization: Secondary | ICD-10-CM

## 2020-01-06 DIAGNOSIS — E669 Obesity, unspecified: Secondary | ICD-10-CM

## 2020-01-06 DIAGNOSIS — I2699 Other pulmonary embolism without acute cor pulmonale: Secondary | ICD-10-CM | POA: Insufficient documentation

## 2020-01-06 LAB — WET PREP FOR TRICH, YEAST, CLUE
Trichomonas Exam: NEGATIVE
Yeast Exam: NEGATIVE

## 2020-01-06 NOTE — Progress Notes (Signed)
Family Planning Visit- Initial Visit  Subjective:  Kimberly Valdez is a 34 y.o. engaged WF [id number] exsmoker  being seen today for an initial well woman visit and school physical.  She is currently using condoms for pregnancy prevention. Patient reports she does not want a pregnancy in the next year.  Patient has the following medical conditions has Headache, migraine and Obesity BMI=41.7 on their problem list.  Chief Complaint  Patient presents with  . Annual Exam    Patient reports last PE 01/2018.  LMP yesterday.  Last sex 01/04/20 with condom; with current partner x 2 years.  Pulmonary embolism with pneumonia 2011.  Asthma.  Employed 30-36 hrs/wk.  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  Living with fiance.  Last cig 2018.  Patient denies chronic medical conditions except asthma  Body mass index is 41.78 kg/m. - Patient is eligible for diabetes screening based on BMI and age >24?  not applicable HA1C ordered? not applicable  Patient reports 1 of partners in last year. Desires STI screening?  Yes  Has patient been screened once for HCV in the past?  No  No results found for: HCVAB  Does the patient have current of drug use, have a partner with drug use, and/or has been incarcerated since last result? No  If yes-- Screen for HCV through [facility]   Does the patient meet criteria for HBV testing? No  Criteria:  -Household, sexual or needle sharing contact with HBV -History of drug use -HIV positive -Those with known Hep C   Health Maintenance Due  Topic Date Due  . Hepatitis C Screening  Never done  . COVID-19 Vaccine (1) Never done  . HIV Screening  Never done  . TETANUS/TDAP  Never done    Review of Systems  Respiratory: Positive for shortness of breath (asthma relieved with Albuterol).   Neurological: Positive for headaches (3x/mo relieved with rest, +N&V, -audio, +vision double/blurry before and after menses).  All other systems reviewed and are negative.   The  following portions of the patient's history were reviewed and updated as appropriate: allergies, current medications, past family history, past medical history, past social history, past surgical history and problem list. Problem list updated.   See flowsheet for other program required questions.  Objective:   Vitals:   01/06/20 1533  BP: 135/88  Pulse: 87  Resp: 12  Temp: (!) 97.5 F (36.4 C)  Weight: 243 lb 6.4 oz (110.4 kg)  Height: 5\' 4"  (1.626 m)    Physical Exam Constitutional:      Appearance: Normal appearance. She is obese.  HENT:     Head: Normocephalic and atraumatic.     Mouth/Throat:     Mouth: Mucous membranes are moist.  Eyes:     Conjunctiva/sclera: Conjunctivae normal.  Cardiovascular:     Rate and Rhythm: Normal rate and regular rhythm.  Pulmonary:     Effort: Pulmonary effort is normal.     Breath sounds: Normal breath sounds.  Chest:     Breasts:        Right: Normal.        Left: Normal.  Abdominal:     Palpations: Abdomen is soft.     Comments: Poor tone, soft without tenderness, increased adipose  Genitourinary:    General: Normal vulva.     Exam position: Lithotomy position.     Vagina: Bleeding (red menses blood) present.     Cervix: Normal.     Uterus: Normal.  Adnexa: Right adnexa normal and left adnexa normal.     Rectum: Normal.  Musculoskeletal:        General: Normal range of motion.     Cervical back: Normal range of motion and neck supple.  Skin:    General: Skin is warm and dry.  Neurological:     Mental Status: She is alert.  Psychiatric:        Mood and Affect: Mood normal.       Assessment and Plan:  Kimberly Valdez is a 34 y.o. female presenting to the [facility] Department for an initial well woman exam/family planning visit  Contraception counseling: Reviewed all forms of birth control options in the tiered based approach. available including abstinence; over the counter/barrier methods; hormonal  contraceptive medication including pill, patch, ring, injection,contraceptive implant, ECP; hormonal and nonhormonal IUDs; permanent sterilization options including vasectomy and the various tubal sterilization modalities. Risks, benefits, and typical effectiveness rates were reviewed.  Questions were answered.  Written information was also given to the patient to review.  Patient desires condoms, this was prescribed for patient. She will follow up in  1 year for surveillance.  She was told to call with any further questions, or with any concerns about this method of contraception.  Emphasized use of condoms 100% of the time for STI prevention.  Patient was offered ECP. ECP was not accepted by the patient. ECP counseling was not given - see RN documentation  1. Obesity, unspecified classification, unspecified obesity type, unspecified whether serious comorbidity present   2. Well woman exam with routine gynecological exam Please give condoms Immunization nurse consult Treat wet mount per standing orders - WET PREP FOR TRICH, YEAST, CLUE - Syphilis Serology, [facility] - HIV [facility] - Chlamydia/Gonorrhea [facility]     No follow-ups on file.  No future appointments.  Alberteen Spindle, CNM

## 2020-01-06 NOTE — Progress Notes (Signed)
Wet mount reviewed, no tx per standing order. Pt declined condoms, already has supply. Provider orders completed.

## 2020-01-06 NOTE — Progress Notes (Signed)
Pt needs school physical form completed.  Wants to continue to use condoms as birth control.

## 2020-03-22 ENCOUNTER — Other Ambulatory Visit: Payer: Self-pay

## 2020-03-22 ENCOUNTER — Ambulatory Visit
Admission: RE | Admit: 2020-03-22 | Discharge: 2020-03-22 | Disposition: A | Discharge status: Home / Self Care | Payer: Medicaid Other | Source: Ambulatory Visit | Attending: Family Medicine | Admitting: Family Medicine

## 2020-03-22 VITALS — BP 125/78 | HR 99 | Temp 99.4°F | Resp 20

## 2020-03-22 DIAGNOSIS — N898 Other specified noninflammatory disorders of vagina: Secondary | ICD-10-CM | POA: Insufficient documentation

## 2020-03-22 DIAGNOSIS — R300 Dysuria: Secondary | ICD-10-CM | POA: Insufficient documentation

## 2020-03-22 DIAGNOSIS — R0602 Shortness of breath: Secondary | ICD-10-CM | POA: Insufficient documentation

## 2020-03-22 DIAGNOSIS — Z1152 Encounter for screening for COVID-19: Secondary | ICD-10-CM | POA: Insufficient documentation

## 2020-03-22 DIAGNOSIS — Z8709 Personal history of other diseases of the respiratory system: Secondary | ICD-10-CM | POA: Insufficient documentation

## 2020-03-22 DIAGNOSIS — B349 Viral infection, unspecified: Secondary | ICD-10-CM | POA: Insufficient documentation

## 2020-03-22 LAB — POCT URINALYSIS DIP (MANUAL ENTRY)
Bilirubin, UA: NEGATIVE
Blood, UA: NEGATIVE
Glucose, UA: NEGATIVE mg/dL
Ketones, POC UA: NEGATIVE mg/dL
Nitrite, UA: NEGATIVE
Protein Ur, POC: NEGATIVE mg/dL
Spec Grav, UA: 1.02 (ref 1.010–1.025)
Urobilinogen, UA: 0.2 E.U./dL
pH, UA: 5.5 (ref 5.0–8.0)

## 2020-03-22 MED ORDER — ALBUTEROL SULFATE HFA 108 (90 BASE) MCG/ACT IN AERS: 2.0000 | INHALATION_SPRAY | Freq: Four times a day (QID) | RESPIRATORY_TRACT | 0 refills | Status: DC | PRN

## 2020-03-22 MED ORDER — PREDNISONE 10 MG (21) PO TBPK: ORAL_TABLET | Freq: Every day | ORAL | 0 refills | Status: DC

## 2020-03-22 MED ORDER — DULERA 100-5 MCG/ACT IN AERO: 2.0000 | INHALATION_SPRAY | Freq: Two times a day (BID) | RESPIRATORY_TRACT | 0 refills | Status: DC

## 2020-03-22 MED ORDER — FLUCONAZOLE 200 MG PO TABS: 200.0000 mg | ORAL_TABLET | Freq: Once | ORAL | 0 refills | Status: AC

## 2020-03-22 NOTE — ED Triage Notes (Signed)
Pt is here with vaginal odor, discharge, burning and itching with frequent while urinating this started Monday, pt mentioned she tested POSITIVE for COVID on Monday, pt has not taken any meds to relieve discomfort.

## 2020-03-22 NOTE — ED Provider Notes (Signed)
[facility]   CC: UTI  SUBJECTIVE:  Kimberly Valdez is a 34 y.o. female who complains of urinary frequency, urgency and dysuria for the past 4 days. Reports hx UTI and BV. Reports vaginal itching, burning and discharge. Reports that she took a rapid Covid test at home that was positive x 2 days ago. Requesting send out Covid testing. Reports that she feels fatigued, has lost her sense of smell, experiencing SOB. Has asthma hx. Requesting refills for albuterol and dulera inhalers. Requesting vaginal swab testing. Patient denies a precipitating event, recent sexual encounter, excessive caffeine intake. Has tried OTC medications without relief.  Admits to similar symptoms in the past.  Denies fever, chills, nausea, vomiting, flank pain, abnormal vaginal discharge or bleeding, hematuria.    ROS: As in HPI.  All other pertinent ROS negative.     Past Medical History:  Diagnosis Date  . Asthma   . RAD (reactive airway disease)    Past Surgical History:  Procedure Laterality Date  . DENTAL SURGERY  09/2018   No Known Allergies No current facility-administered medications on file prior to encounter.   Current Outpatient Medications on File Prior to Encounter  Medication Sig Dispense Refill  . fluticasone (FLONASE) 50 MCG/ACT nasal spray Place 2 sprays into both nostrils daily. (Patient not taking: Reported on 09/23/2018) 16 g 0  . loratadine (CLARITIN) 10 MG tablet Take 10 mg by mouth daily. (Patient not taking: Reported on 01/06/2020)    . oseltamivir (TAMIFLU) 75 MG capsule Take 1 capsule (75 mg total) by mouth 2 (two) times daily. X 5 days (Patient not taking: Reported on 09/23/2018) 10 capsule 0  . Spacer/Aero-Holding Chambers (AEROCHAMBER PLUS) inhaler Use as instructed (Patient not taking: Reported on 09/23/2018) 1 each 2   Social History   Socioeconomic History  . Marital status: Married    Spouse name: Not on file  . Number of children: Not on file  . Years of education: Not  on file  . Highest education level: Not on file  Occupational History  . Not on file  Tobacco Use  . Smoking status: Never Smoker  . Smokeless tobacco: Never Used  Vaping Use  . Vaping Use: Never used  Substance and Sexual Activity  . Alcohol use: Yes  . Drug use: Never  . Sexual activity: Yes    Birth control/protection: Condom  Other Topics Concern  . Not on file  Social History Narrative  . Not on file   Social Determinants of Health   Financial Resource Strain:   . Difficulty of Paying Living Expenses: Not on file  Food Insecurity:   . Worried About Programme researcher, broadcasting/film/video in the Last Year: Not on file  . Ran Out of Food in the Last Year: Not on file  Transportation Needs:   . Lack of Transportation (Medical): Not on file  . Lack of Transportation (Non-Medical): Not on file  Physical Activity:   . Days of Exercise per Week: Not on file  . Minutes of Exercise per Session: Not on file  Stress:   . Feeling of Stress : Not on file  Social Connections:   . Frequency of Communication with Friends and Family: Not on file  . Frequency of Social Gatherings with Friends and Family: Not on file  . Attends Religious Services: Not on file  . Active Member of Clubs or Organizations: Not on file  . Attends Banker Meetings: Not on file  . Marital Status: Not  on file  Intimate Partner Violence: Not At Risk  . Fear of Current or Ex-Partner: No  . Emotionally Abused: No  . Physically Abused: No  . Sexually Abused: No   Family History  Problem Relation Age of Onset  . Hyperlipidemia Mother   . Diabetes Father   . Hyperlipidemia Father   . Cirrhosis Father   . Hypertension Father   . Asthma Sister   . COPD Maternal Grandfather   . Hyperlipidemia Maternal Grandfather   . Heart disease Paternal Grandmother   . Diabetes Paternal Grandmother   . Hyperlipidemia Paternal Grandmother   . Alzheimer's disease Paternal Grandmother   . Heart disease Paternal Grandfather     . Diabetes Paternal Grandfather   . Hyperlipidemia Maternal Grandmother     OBJECTIVE:  Vitals:   03/22/20 1338  BP: 125/78  Pulse: 99  Resp: 20  Temp: 99.4 F (37.4 C)  TempSrc: Oral  SpO2: 96%   General appearance: AOx3 in no acute distress HEENT: NCAT. Oropharynx clear.  Lungs: wheezing in bilateral bases Heart: regular rate and rhythm. Radial pulses 2+ symmetrical bilaterally Abdomen: soft; non-distended; no tenderness; bowel sounds present; no guarding or rebound tenderness Back: no CVA tenderness Extremities: no edema; symmetrical with no gross deformities Skin: warm and dry Neurologic: Ambulates from chair to exam table without difficulty Psychological: alert and cooperative; normal mood and affect  Labs Reviewed  POCT URINALYSIS DIP (MANUAL ENTRY) - Abnormal; Notable for the following components:      Result Value   Clarity, UA cloudy (*)    Leukocytes, UA Small (1+) (*)    All other components within normal limits  URINE CULTURE  NOVEL CORONAVIRUS, NAA  CERVICOVAGINAL ANCILLARY ONLY    ASSESSMENT & PLAN:  1. Viral illness   2. Dysuria   3. Encounter for screening for COVID-19   4. SOB (shortness of breath)   5. Vaginal itching   6. History of asthma     Meds ordered this encounter  Medications  . mometasone-formoterol (DULERA) 100-5 MCG/ACT AERO    Sig: Inhale 2 puffs into the lungs 2 (two) times daily.    Dispense:  13 g    Refill:  0    Order Specific Question:   Supervising Provider    Answer:   Merrilee Jansky [id number]  . albuterol (VENTOLIN HFA) 108 (90 Base) MCG/ACT inhaler    Sig: Inhale 2 puffs into the lungs every 6 (six) hours as needed for wheezing or shortness of breath.    Dispense:  18 g    Refill:  0    Order Specific Question:   Supervising Provider    Answer:   Merrilee Jansky [id number]  . predniSONE (STERAPRED UNI-PAK 21 TAB) 10 MG (21) TBPK tablet    Sig: Take by mouth daily. Take 6 tabs by mouth daily  for 2 days, then  5 tabs for 2 days, then 4 tabs for 2 days, then 3 tabs for 2 days, 2 tabs for 2 days, then 1 tab by mouth daily for 2 days    Dispense:  42 tablet    Refill:  0    Order Specific Question:   Supervising Provider    Answer:   Merrilee Jansky [id number]  . fluconazole (DIFLUCAN) 200 MG tablet    Sig: Take 1 tablet (200 mg total) by mouth once for 1 dose.    Dispense:  2 tablet    Refill:  0    Order Specific  Question:   Supervising Provider    Answer:   Merrilee Jansky [[id number]]   Refilled Dulera Refilled albuterol inhaler Prescribed steroid taper Prescribed fluconazole Swab tests should be back in about 2 days We will call with abnormal results that require further treatment  Urine culture sent  We will call you with abnormal results that need further treatment Push fluids and get plenty of rest Follow up with PCP if symptoms persists  Covid swab obtained in office today.  Patient instructed to quarantine until results are back and negative.  If results are negative, patient may resume daily schedule as tolerated once they are fever free for 24 hours without the use of antipyretic medications.  If results are positive, patient instructed to quarantine 10 days from today.  Patient instructed to follow-up with primary care with this office as needed.  Patient instructed to follow-up in the ER for trouble swallowing, trouble breathing, other concerning symptoms.  Outlined signs and symptoms indicating need for more acute intervention Patient verbalized understanding After Visit Summary given     Moshe Cipro, NP 03/22/20 1356

## 2020-03-22 NOTE — Discharge Instructions (Signed)
You may have a urinary tract infection.   We are going to culture your urine and will call you as soon as we have the results.   Drink plenty of water, 8-10 glasses per day.   Swab testing will be back in about 2 days. I have sent in fluconazole for you. Take one tablet today and if symptoms are still present in 3 days, take the 2nd tablet.  Refilled dulera and albuterol inhalers  I have sent in a steroid taper for you to take as well. Take 6 tablets for the first two days, take 5 tablets for day three and four, take 4 tablets for days five and six, take 3 tablets for days seven and eight, take 2 tablets for day nine and ten, then take 1 tablet for days eleven and twelve.  Your COVID test is pending.  You should self quarantine until the test result is back.    Take Tylenol as needed for fever or discomfort.  Rest and keep yourself hydrated.    Go to the emergency department if you develop acute worsening symptoms.

## 2020-03-23 ENCOUNTER — Telehealth (HOSPITAL_COMMUNITY): Payer: Self-pay | Admitting: Emergency Medicine

## 2020-03-23 NOTE — Telephone Encounter (Signed)
This RN received call from microbiology lab that patient's urine sample was not labeled and will not be able to be run.  This RN called patient to make her aware to return for additional sample, patient states she is not concerned about a UTI at this time and will wait for COVID results.  Will return call if she changes her mind in the next few days.

## 2020-03-24 ENCOUNTER — Telehealth (HOSPITAL_COMMUNITY): Payer: Self-pay | Admitting: Emergency Medicine

## 2020-03-24 LAB — CERVICOVAGINAL ANCILLARY ONLY
Bacterial Vaginitis (gardnerella): POSITIVE — AB
Candida Glabrata: NEGATIVE
Candida Vaginitis: POSITIVE — AB
Chlamydia: NEGATIVE
Comment: NEGATIVE
Comment: NEGATIVE
Comment: NEGATIVE
Comment: NEGATIVE
Comment: NEGATIVE
Comment: NORMAL
Neisseria Gonorrhea: NEGATIVE
Trichomonas: NEGATIVE

## 2020-03-24 LAB — SARS-COV-2, NAA 2 DAY TAT

## 2020-03-24 LAB — NOVEL CORONAVIRUS, NAA: SARS-CoV-2, NAA: DETECTED — AB

## 2020-03-24 MED ORDER — METRONIDAZOLE 500 MG PO TABS: 500.0000 mg | ORAL_TABLET | Freq: Two times a day (BID) | ORAL | 0 refills | Status: DC

## 2020-03-25 ENCOUNTER — Telehealth (HOSPITAL_COMMUNITY): Payer: Self-pay | Admitting: Adult Health

## 2020-03-25 NOTE — Telephone Encounter (Signed)
Called and LMOM regarding monoclonal antibody treatment for COVID 19 given to those who are at risk for complications and/or hospitalization of the virus.  Patient meets criteria based on: BMI greater than 25  Call back number given: [phone]  My chart message: sent  Robyn Nohr, NP  

## 2020-06-28 ENCOUNTER — Telehealth: Payer: Self-pay | Admitting: Pharmacy Technician

## 2020-06-28 NOTE — Telephone Encounter (Signed)
Patient failed to provide 2021 proof of income.  No additional medication assistance will be provided by [facility] without the required proof of income documentation.  Patient notified by letter.  Sherilyn Dacosta Care Manager [facility]   [address] [place], [place]  [address]   June 28, 2020    Kimberly Valdez [address] [place], [place]  [address]  Dear Kimberly Valdez:  This is to inform you that you are no longer eligible to receive medication assistance at [facility].  The reason(s) are:    _____Your total gross monthly household income exceeds 250% of the Federal Poverty Level.   _____Tangible assets (savings, checking, stocks/bonds, pension, retirement, etc.) exceeds our limit  _____You are eligible to receive benefits from [facility], [facility] or HIV Medication              Assistance Program _____You are eligible to receive benefits from a Medicare Part "D" plan _____You have prescription insurance  _____You are not an [facility] resident __X__Failure to provide all requested proof of income information for 2021.    Medication assistance will resume once all requested financial information has been returned to our clinic.  If you have questions, please contact our clinic at [phone].    Thank you,  [facility]

## 2021-01-08 ENCOUNTER — Ambulatory Visit (LOCAL_COMMUNITY_HEALTH_CENTER): Payer: Self-pay

## 2021-01-08 ENCOUNTER — Other Ambulatory Visit: Payer: Self-pay

## 2021-01-08 DIAGNOSIS — Z0184 Encounter for antibody response examination: Secondary | ICD-10-CM

## 2021-01-08 DIAGNOSIS — Z111 Encounter for screening for respiratory tuberculosis: Secondary | ICD-10-CM

## 2021-01-08 DIAGNOSIS — Z23 Encounter for immunization: Secondary | ICD-10-CM

## 2021-01-08 NOTE — Progress Notes (Signed)
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 Pt presents childhood vaccine record and vaccines placed into [facility]. Pt requests Tdap, Hep B,#3 and MMR #2  today. Eligible for these vaccines and meets criteria for state supplied Tdap, Hep B, and MMR. Pt explains at last Hep B vaccine (12/2019) she experienced itching at injection site and chest area. Relieved with benadryl. Consult Vertell Novak, MD who gives ok to proceed with vaccines today and recommends benadryl if itching occurs. RN carried out provider orders. Pt tolerated vaccines well today. Advised of recommendation for benadryl if itching and to seek immediate medical attention if SOB, throat swelling occurs. Pt in agreement. Updated [facility] copy given and explained. ROI signed for Varicella titer and pt will pick up copy of results at PPDR appt on 01/11/21. Pt sent to lab for venipuncture. Josie Saunders, RN

## 2021-01-09 LAB — VARICELLA ZOSTER ANTIBODY, IGG: Varicella zoster IgG: 697 index (ref >165)

## 2021-01-09 NOTE — Progress Notes (Addendum)
Attestation of Medical Director for clinical support staff: I agree with the care provided to this patient and was available for consultation.  I was consulted at the point of care and documentation reflects my recommendations.   Catherene Kaleta Niles Vitali Seibert, MD, MPH, ABFM [facility] Medical Director  

## 2021-01-09 NOTE — Progress Notes (Signed)
Varicella titer results indicate immunity. As per RN documentation, ROI for results has been signed and client will retrieve at 01/11/2021 PPDR appt. Copy of lab result attached to PPDR paper in Nurse Clinic workroom. Jossie Ng, RN

## 2021-01-11 ENCOUNTER — Ambulatory Visit (LOCAL_COMMUNITY_HEALTH_CENTER): Payer: Medicaid Other

## 2021-01-11 ENCOUNTER — Other Ambulatory Visit: Payer: Self-pay

## 2021-01-11 DIAGNOSIS — Z7185 Encounter for immunization safety counseling: Secondary | ICD-10-CM

## 2021-01-11 DIAGNOSIS — Z111 Encounter for screening for respiratory tuberculosis: Secondary | ICD-10-CM

## 2021-01-11 LAB — TB SKIN TEST
Induration: 0 mm
TB Skin Test: NEGATIVE

## 2021-01-11 NOTE — Progress Notes (Signed)
In Nurse Clinic for PPDR, 0 mm Negative. Copy of Varicella titer results (consistent with immunity) given and explained to pt. No vaccine indicated. [facility] updated and copy given. Jerel Shepherd, RN

## 2021-02-14 ENCOUNTER — Emergency Department
Admission: EM | Admit: 2021-02-14 | Discharge: 2021-02-14 | Disposition: A | Discharge status: Home / Self Care | Payer: Medicaid Other | Attending: Emergency Medicine | Admitting: Emergency Medicine

## 2021-02-14 ENCOUNTER — Encounter: Payer: Self-pay | Admitting: Emergency Medicine

## 2021-02-14 ENCOUNTER — Other Ambulatory Visit: Payer: Self-pay

## 2021-02-14 DIAGNOSIS — L509 Urticaria, unspecified: Secondary | ICD-10-CM | POA: Insufficient documentation

## 2021-02-14 DIAGNOSIS — Z7951 Long term (current) use of inhaled steroids: Secondary | ICD-10-CM | POA: Insufficient documentation

## 2021-02-14 DIAGNOSIS — J45909 Unspecified asthma, uncomplicated: Secondary | ICD-10-CM | POA: Insufficient documentation

## 2021-02-14 DIAGNOSIS — R21 Rash and other nonspecific skin eruption: Secondary | ICD-10-CM | POA: Insufficient documentation

## 2021-02-14 LAB — CBC WITH DIFFERENTIAL/PLATELET
Abs Immature Granulocytes: 0.03 10*3/uL (ref 0.00–0.07)
Basophils Absolute: 0 10*3/uL (ref 0.0–0.1)
Basophils Relative: 0 %
Eosinophils Absolute: 0.2 10*3/uL (ref 0.0–0.5)
Eosinophils Relative: 2 %
HCT: 43.5 % (ref 36.0–46.0)
Hemoglobin: 14.9 g/dL (ref 12.0–15.0)
Immature Granulocytes: 0 %
Lymphocytes Relative: 20 %
Lymphs Abs: 2.1 10*3/uL (ref 0.7–4.0)
MCH: 29.6 pg (ref 26.0–34.0)
MCHC: 34.3 g/dL (ref 30.0–36.0)
MCV: 86.5 fL (ref 80.0–100.0)
Monocytes Absolute: 0.6 10*3/uL (ref 0.1–1.0)
Monocytes Relative: 6 %
Neutro Abs: 7.5 10*3/uL (ref 1.7–7.7)
Neutrophils Relative %: 72 %
Platelets: 299 10*3/uL (ref 150–400)
RBC: 5.03 MIL/uL (ref 3.87–5.11)
RDW: 12.8 % (ref 11.5–15.5)
WBC: 10.4 10*3/uL (ref 4.0–10.5)
nRBC: 0 % (ref 0.0–0.2)

## 2021-02-14 LAB — COMPREHENSIVE METABOLIC PANEL
ALT: 48 U/L — ABNORMAL HIGH (ref 0–44)
AST: 27 U/L (ref 15–41)
Albumin: 3.9 g/dL (ref 3.5–5.0)
Alkaline Phosphatase: 63 U/L (ref 38–126)
Anion gap: 7 (ref 5–15)
BUN: 11 mg/dL (ref 6–20)
CO2: 26 mmol/L (ref 22–32)
Calcium: 8.7 mg/dL — ABNORMAL LOW (ref 8.9–10.3)
Chloride: 105 mmol/L (ref 98–111)
Creatinine, Ser: 0.66 mg/dL (ref 0.44–1.00)
GFR, Estimated: >60 mL/min (ref >60)
Glucose, Bld: 92 mg/dL (ref 70–99)
Potassium: 3.3 mmol/L — ABNORMAL LOW (ref 3.5–5.1)
Sodium: 138 mmol/L (ref 135–145)
Total Bilirubin: 0.7 mg/dL (ref 0.3–1.2)
Total Protein: 6.8 g/dL (ref 6.5–8.1)

## 2021-02-14 LAB — TROPONIN I (HIGH SENSITIVITY): Troponin I (High Sensitivity): <2 ng/L (ref <18)

## 2021-02-14 MED ORDER — FAMOTIDINE IN NACL 20-0.9 MG/50ML-% IV SOLN
20.0000 mg | Freq: Once | INTRAVENOUS | Status: AC
  Administered 2021-02-14: 20 mg via INTRAVENOUS
  Filled 2021-02-14: qty 50

## 2021-02-14 MED ORDER — SODIUM CHLORIDE 0.9 % IV BOLUS
1000.0000 mL | Freq: Once | INTRAVENOUS | Status: AC
  Administered 2021-02-14: 1000 mL via INTRAVENOUS

## 2021-02-14 MED ORDER — DIPHENHYDRAMINE HCL 50 MG/ML IJ SOLN
50.0000 mg | Freq: Once | INTRAMUSCULAR | Status: AC
  Administered 2021-02-14: 50 mg via INTRAVENOUS
  Filled 2021-02-14: qty 1

## 2021-02-14 MED ORDER — PREDNISONE 10 MG PO TABS
ORAL_TABLET | ORAL | 0 refills | Status: DC
  Filled 2021-02-14: qty 21, 6d supply, fill #0

## 2021-02-14 MED ORDER — METHYLPREDNISOLONE SODIUM SUCC 125 MG IJ SOLR
125.0000 mg | Freq: Once | INTRAMUSCULAR | Status: AC
  Administered 2021-02-14: 125 mg via INTRAMUSCULAR
  Filled 2021-02-14: qty 2

## 2021-02-14 NOTE — ED Provider Notes (Signed)
ED ECG REPORT I, Sharyn Creamer, the attending physician, personally viewed and interpreted this ECG.  Date: 02/14/2021 EKG Time: 1930 Rate: 80 Rhythm: normal sinus rhythm QRS Axis: normal Intervals: normal ST/T Wave abnormalities: normal Narrative Interpretation: no evidence of acute ischemia    Sharyn Creamer, MD 02/14/21 1932

## 2021-02-14 NOTE — Discharge Instructions (Addendum)
Take tapered steroid as directed. Take 40 mg of Pepcid once daily. Take 25 mg of Benadryl every 8 hours.

## 2021-02-14 NOTE — ED Provider Notes (Signed)
[facility]-EMERGENCY DEPARTMENT  ____________________________________________  Time seen: Approximately 6:38 PM  I have reviewed the triage vital signs and the nursing notes.   HISTORY  Chief Complaint Rash   Historian Patient     HPI Kimberly Valdez is a 35 y.o. female presents to the emergency department with diffuse urticaria of the upper and lower extremities, chest and back.  Patient reports that she is allergic to eggs and shellfish but has had no exposures to her knowledge.  She states that she is also had some epigastric pain that feels like heartburn which is atypical for her.  She denies shortness of breath, wheezing, chest pain or chest tightness.  No history of anaphylaxis in the past.  She has been taking Benadryl at home with little improvement in her symptoms.  No other alleviating measures have been attempted.   Past Medical History:  Diagnosis Date   Asthma    RAD (reactive airway disease)      Immunizations up to date:  Yes.     Past Medical History:  Diagnosis Date   Asthma    RAD (reactive airway disease)     Patient Active Problem List   Diagnosis Date Noted   Obesity BMI=41.7 01/06/2020   Pulmonary embolism (HCC) with pneumonia 2011 01/06/2020   Headache, migraine--never dx'd by MD 01/27/2017    Past Surgical History:  Procedure Laterality Date   DENTAL SURGERY  09/2018    Prior to Admission medications   Medication Sig Start Date End Date Taking? Authorizing Provider  albuterol (VENTOLIN HFA) 108 (90 Base) MCG/ACT inhaler Inhale 2 puffs into the lungs every 6 (six) hours as needed for wheezing or shortness of breath. 03/22/20   Moshe Cipro, NP  fluticasone (FLONASE) 50 MCG/ACT nasal spray Place 2 sprays into both nostrils daily. Patient not taking: Reported on 09/23/2018 07/14/16   Domenick Gong, MD  loratadine (CLARITIN) 10 MG tablet Take 10 mg by mouth daily. Patient not taking: Reported on 01/06/2020    [provider]   metroNIDAZOLE (FLAGYL) 500 MG tablet Take 1 tablet (500 mg total) by mouth 2 (two) times daily. Patient not taking: Reported on 01/08/2021 03/24/20   Merrilee Jansky, MD  mometasone-formoterol [facility]) 100-5 MCG/ACT AERO Inhale 2 puffs into the lungs 2 (two) times daily. 03/22/20   Moshe Cipro, NP  oseltamivir (TAMIFLU) 75 MG capsule Take 1 capsule (75 mg total) by mouth 2 (two) times daily. X 5 days Patient not taking: Reported on 09/23/2018 07/14/16   Domenick Gong, MD  predniSONE (STERAPRED UNI-PAK 21 TAB) 10 MG (21) TBPK tablet Take by mouth daily. Take 6 tabs by mouth daily  for 2 days, then 5 tabs for 2 days, then 4 tabs for 2 days, then 3 tabs for 2 days, 2 tabs for 2 days, then 1 tab by mouth daily for 2 days Patient not taking: Reported on 01/08/2021 03/22/20   Moshe Cipro, NP  Spacer/Aero-Holding Chambers (AEROCHAMBER PLUS) inhaler Use as instructed Patient not taking: Reported on 09/23/2018 07/14/16   Domenick Gong, MD    Allergies Eggs or egg-derived products, Grass pollen([id number] vern), and Shellfish allergy  Family History  Problem Relation Age of Onset   Hyperlipidemia Mother    Diabetes Father    Hyperlipidemia Father    Cirrhosis Father    Hypertension Father    Asthma Sister    COPD Maternal Grandfather    Hyperlipidemia Maternal Grandfather    Heart disease Paternal Grandmother    Diabetes Paternal Grandmother  Hyperlipidemia Paternal Grandmother    Alzheimer's disease Paternal Grandmother    Heart disease Paternal Grandfather    Diabetes Paternal Grandfather    Hyperlipidemia Maternal Grandmother     Social History Social History   Tobacco Use   Smoking status: Never   Smokeless tobacco: Never  Vaping Use   Vaping Use: Never used  Substance Use Topics   Alcohol use: Yes   Drug use: Never     Review of Systems  Constitutional: No fever/chills Eyes:  No discharge ENT: No upper respiratory complaints. Respiratory: no cough.  No SOB/ use of accessory muscles to breath Gastrointestinal:   No nausea, no vomiting.  No diarrhea.  No constipation. Musculoskeletal: Negative for musculoskeletal pain. Skin: Patient has urticaria.    ____________________________________________   PHYSICAL EXAM:  VITAL SIGNS: ED Triage Vitals [02/14/21 1537]  Enc Vitals Group     BP (!) 148/112     Pulse Rate 98     Resp 17     Temp 98 F (36.7 C)     Temp Source Oral     SpO2 96 %     Weight 243 lb 6.2 oz (110.4 kg)     Height 5\' 4"  (1.626 m)     Head Circumference      Peak Flow      Pain Score 0     Pain Loc      Pain Edu?      Excl. in GC?      Constitutional: Alert and oriented. Well appearing and in no acute distress. Eyes: Conjunctivae are normal. PERRL. EOMI. Head: Atraumatic. ENT:      Nose: No congestion/rhinnorhea.      Mouth/Throat: Mucous membranes are moist.  Neck: No stridor.  No cervical spine tenderness to palpation. Hematological/Lymphatic/Immunilogical: No cervical lymphadenopathy. Cardiovascular: Normal rate, regular rhythm. Normal S1 and S2.  Good peripheral circulation. Respiratory: Normal respiratory effort without tachypnea or retractions. Lungs CTAB. Good air entry to the bases with no decreased or absent breath sounds Gastrointestinal: Bowel sounds x 4 quadrants. Soft and nontender to palpation. No guarding or rigidity. No distention. Musculoskeletal: Full range of motion to all extremities. No obvious deformities noted Neurologic:  Normal for age. No gross focal neurologic deficits are appreciated.  Skin: Patient has urticaria.  Psychiatric: Mood and affect are normal for age. Speech and behavior are normal.   ____________________________________________   LABS (all labs ordered are listed, but only abnormal results are displayed)  Labs Reviewed  CBC WITH DIFFERENTIAL/PLATELET  COMPREHENSIVE METABOLIC PANEL  TROPONIN I (HIGH SENSITIVITY)    ____________________________________________  EKG   ____________________________________________  RADIOLOGY   No results found.  ____________________________________________    PROCEDURES  Procedure(s) performed:     Procedures     Medications - No data to display   ____________________________________________   INITIAL IMPRESSION / ASSESSMENT AND PLAN / ED COURSE  Pertinent labs & imaging results that were available during my care of the patient were reviewed by me and considered in my medical decision making (see chart for details).      Assessment and plan:  Urticaria:  35 year old female presents to the emergency department with diffuse urticaria of the trunk, upper extremities and lower extremities.  Patient was mildly hypertensive but vital signs were otherwise reassuring.  Patient was given IV Solu-Medrol, Pepcid and Benadryl.  Patient's urticaria almost completely resolved prior to discharge.  She was discharged with tapered prednisone and was advised to take 40 mg of Pepcid once daily  and 25 mg of Benadryl every 8 hours.  Return precautions were given to return with new or worsening symptoms.  All patient questions were answered.     ____________________________________________  FINAL CLINICAL IMPRESSION(S) / ED DIAGNOSES  Final diagnoses:  None      NEW MEDICATIONS STARTED DURING THIS VISIT:  ED Discharge Orders     None           This chart was dictated using voice recognition software/Dragon. Despite best efforts to proofread, errors can occur which can change the meaning. Any change was purely unintentional.     Orvil Feil, PA-C 02/14/21 2214    Merwyn Katos, MD 02/14/21 2312

## 2021-02-14 NOTE — ED Triage Notes (Signed)
Pt comes into the ED via POV c/o rash all over her body.  Pt states she woke up with it on MOnday and the rash initially responded to benadryl but then it has come back.  Pt denies any new lotions, detergents, etc.  Pt denies any pain with the rash, swelling of the throat, etc.  Pt in NAD with even and unlabored respirations.

## 2021-02-15 ENCOUNTER — Other Ambulatory Visit: Payer: Self-pay

## 2022-01-10 ENCOUNTER — Telehealth: Payer: Self-pay

## 2022-01-10 NOTE — Telephone Encounter (Signed)
Nursing saff gave a message to me asking me to call patient and see what patient may need after leaving a message requesting an appointment. There was no voicemail and I was unable to leave a message on patients voicemail.

## 2022-01-24 ENCOUNTER — Ambulatory Visit: Payer: Commercial Managed Care - PPO | Admitting: Internal Medicine

## 2022-01-24 ENCOUNTER — Telehealth: Payer: Self-pay

## 2022-01-24 ENCOUNTER — Encounter: Payer: Self-pay | Admitting: Internal Medicine

## 2022-01-24 VITALS — BP 124/96 | HR 89 | Temp 98.3°F | Resp 16 | Ht 64.0 in | Wt 254.6 lb

## 2022-01-24 DIAGNOSIS — J4520 Mild intermittent asthma, uncomplicated: Secondary | ICD-10-CM

## 2022-01-24 DIAGNOSIS — Z8709 Personal history of other diseases of the respiratory system: Secondary | ICD-10-CM

## 2022-01-24 DIAGNOSIS — G4719 Other hypersomnia: Secondary | ICD-10-CM | POA: Diagnosis not present

## 2022-01-24 MED ORDER — ALBUTEROL SULFATE HFA 108 (90 BASE) MCG/ACT IN AERS: 2.0000 | INHALATION_SPRAY | Freq: Four times a day (QID) | RESPIRATORY_TRACT | 3 refills | Status: DC | PRN

## 2022-01-24 MED ORDER — DULERA 100-5 MCG/ACT IN AERO: 2.0000 | INHALATION_SPRAY | Freq: Two times a day (BID) | RESPIRATORY_TRACT | 3 refills | Status: DC

## 2022-01-24 NOTE — Telephone Encounter (Signed)
SS order placed in [facility] folder-Toni 

## 2022-01-24 NOTE — Progress Notes (Signed)
Pt uses Dulera inhaler and is having a hard time financially to be able to get due to high Copay.  Pt is going to college and can't afford to get [facility] at the moment and per Dr Lavera Guise we gave her 2 samples of Trelegy 100-62.5-25 MCG and I (Kolston Lacount) gave pt information to go on [employer] website to see if she qualifies for the coupon for $15.00 a month for [facility].  Advised pt to let me know if she was able to get coupon and use it and in the meantime I will look into Patient assistance help for [facility] and if they have it I will let her know and send her the application to complete.    Pt information: Fax: [phone] Email: [email or web address]

## 2022-01-24 NOTE — Progress Notes (Signed)
[facility] [address] [place], [place] [address]  Pulmonary Sleep Medicine   Office Visit Note  Patient Name: Kimberly Valdez DOB: [birth year] MRN [id number]  Date of Service: 01/24/2022  Complaints/HPI: h/O asthma. She states that overall she has been doing well. Asthma has been under fairly controlled. She has gained weight. She has no SOB at rest and since she exerts she has noted a little SOB. She states that she has been having no cough no congestion. No admissions to the hospital. Denies having chest pain. She has been on dulera and also albuterol for rescue. She does note some difficulty falling asleep. She states that she has had some work shift issues in the past. Does snore not sure. She gained about 20-40 pounds  ROS  General: (-) fever, (-) chills, (-) night sweats, (-) weakness Skin: (-) rashes, (-) itching,. Eyes: (-) visual changes, (-) redness, (-) itching. Nose and Sinuses: (-) nasal stuffiness or itchiness, (-) postnasal drip, (-) nosebleeds, (-) sinus trouble. Mouth and Throat: (-) sore throat, (-) hoarseness. Neck: (-) swollen glands, (-) enlarged thyroid, (-) neck pain. Respiratory: - cough, (-) bloody sputum, - shortness of breath, - wheezing. Cardiovascular: - ankle swelling, (-) chest pain. Lymphatic: (-) lymph node enlargement. Neurologic: (-) numbness, (-) tingling. Psychiatric: (-) anxiety, (-) depression   Current Medication: Outpatient Encounter Medications as of 01/24/2022  Medication Sig   albuterol (VENTOLIN HFA) 108 (90 Base) MCG/ACT inhaler Inhale 2 puffs into the lungs every 6 (six) hours as needed for wheezing or shortness of breath.   aspirin-acetaminophen-caffeine (EXCEDRIN MIGRAINE) 250-250-65 MG tablet Take 2 tablets by mouth every 6 (six) hours as needed for headache.   ibuprofen (ADVIL) 200 MG tablet Take 200 mg by mouth every 6 (six) hours as needed.   mometasone-formoterol (DULERA) 100-5 MCG/ACT AERO Inhale 2 puffs into the  lungs 2 (two) times daily.   [DISCONTINUED] fluticasone (FLONASE) 50 MCG/ACT nasal spray Place 2 sprays into both nostrils daily. (Patient not taking: Reported on 09/23/2018)   [DISCONTINUED] loratadine (CLARITIN) 10 MG tablet Take 10 mg by mouth daily. (Patient not taking: Reported on 01/06/2020)   [DISCONTINUED] metroNIDAZOLE (FLAGYL) 500 MG tablet Take 1 tablet (500 mg total) by mouth 2 (two) times daily. (Patient not taking: Reported on 01/08/2021)   [DISCONTINUED] oseltamivir (TAMIFLU) 75 MG capsule Take 1 capsule (75 mg total) by mouth 2 (two) times daily. X 5 days (Patient not taking: Reported on 09/23/2018)   [DISCONTINUED] predniSONE (DELTASONE) 10 MG tablet Take 6 tablets by mouth once daily on Day 1. Then take 5 tablets once on Day 2, take 4 tablets once on Day 3, take 3 tablets once on Day 4, take 2 tablets once on Day 5 and take 1 tablet once on Day 6.   [DISCONTINUED] Spacer/Aero-Holding Chambers (AEROCHAMBER PLUS) inhaler Use as instructed (Patient not taking: Reported on 09/23/2018)   No facility-administered encounter medications on file as of 01/24/2022.    Surgical History: Past Surgical History:  Procedure Laterality Date   DENTAL SURGERY  09/2018    Medical History: Past Medical History:  Diagnosis Date   Asthma    RAD (reactive airway disease)     Family History: Family History  Problem Relation Age of Onset   Hyperlipidemia Mother    Diabetes Father    Hyperlipidemia Father    Cirrhosis Father    Hypertension Father    Asthma Sister    Hernia Brother    Hyperlipidemia Maternal Grandmother  COPD Maternal Grandfather    Hyperlipidemia Maternal Grandfather    Heart disease Paternal Grandmother    Diabetes Paternal Grandmother    Hyperlipidemia Paternal Grandmother    Alzheimer's disease Paternal Grandmother    Heart disease Paternal Grandfather    Diabetes Paternal Grandfather     Social History: Social History   Socioeconomic History   Marital status:  Married    Spouse name: Not on file   Number of children: Not on file   Years of education: Not on file   Highest education level: Not on file  Occupational History   Not on file  Tobacco Use   Smoking status: Never   Smokeless tobacco: Never  Vaping Use   Vaping Use: Never used  Substance and Sexual Activity   Alcohol use: Not Currently   Drug use: Never   Sexual activity: Yes    Partners: Male    Birth control/protection: Condom  Other Topics Concern   Not on file  Social History Narrative   Not on file   Social Determinants of Health   Financial Resource Strain: Not on file  Food Insecurity: Not on file  Transportation Needs: Not on file  Physical Activity: Not on file  Stress: Not on file  Social Connections: Not on file  Intimate Partner Violence: Not At Risk (01/06/2020)   Humiliation, Afraid, Rape, and Kick questionnaire    Fear of Current or Ex-Partner: No    Emotionally Abused: No    Physically Abused: No    Sexually Abused: No    Vital Signs: Blood pressure (!) 124/96, pulse 89, temperature 98.3 F (36.8 C), resp. rate 16, height 5\' 4"  (1.626 m), weight 254 lb 9.6 oz (115.5 kg), SpO2 97 %.  Examination: General Appearance: The patient is well-developed, well-nourished, and in no distress. Skin: Gross inspection of skin unremarkable. Head: normocephalic, no gross deformities. Eyes: no gross deformities noted. ENT: ears appear grossly normal no exudates. Neck: Supple. No thyromegaly. No LAD. Respiratory: no rhonchi noted at this time. Cardiovascular: Normal S1 and S2 without murmur or rub. Extremities: No cyanosis. pulses are equal. Neurologic: Alert and oriented. No involuntary movements.  LABS: No results found for this or any previous visit (from the past 2160 hour(s)).  Radiology: No results found.  No results found.  No results found.    Assessment and Plan: Patient Active Problem List   Diagnosis Date Noted   Obesity BMI=41.7  01/06/2020   Pulmonary embolism (HCC) with pneumonia 2011 01/06/2020   Headache, migraine--never dx'd by MD 01/27/2017    1. History of asthma She has been stable and will need meds refilled - albuterol (VENTOLIN HFA) 108 (90 Base) MCG/ACT inhaler; Inhale 2 puffs into the lungs every 6 (six) hours as needed for wheezing or shortness of breath.  Dispense: 18 g; Refill: 3 - mometasone-formoterol (DULERA) 100-5 MCG/ACT AERO; Inhale 2 puffs into the lungs 2 (two) times daily.  Dispense: 13 g; Refill: 3  2. Chronic asthma, mild intermittent, uncomplicated Has not had any exacerbations. Will continue current regimen - Pulmonary function test; Future  3. Obesity, morbid (HCC) Obesity Counseling: Had a lengthy discussion regarding patients BMI and weight issues. Patient was instructed on portion control as well as increased activity. Also discussed caloric restrictions with trying to maintain intake less than 2000 Kcal. Discussions were made in accordance with the 5As of weight management. Simple actions such as not eating late and if able to, taking a walk is suggested.   4. Other  hypersomnia Will schedule for sleep study home study. She has some fatigue and poor sleep quality feels tired during the daytime also   General Counseling: I have discussed the findings of the evaluation and examination with Kimberly Valdez.  I have also discussed any further diagnostic evaluation thatmay be needed or ordered today. Kimberly Valdez verbalizes understanding of the findings of todays visit. We also reviewed her medications today and discussed drug interactions and side effects including but not limited excessive drowsiness and altered mental states. We also discussed that there is always a risk not just to her but also people around her. she has been encouraged to call the office with any questions or concerns that should arise related to todays visit.  No orders of the defined types were placed in this encounter.    Time  spent: 42  I have personally obtained a history, examined the patient, evaluated laboratory and imaging results, formulated the assessment and plan and placed orders.    Yevonne Pax, MD [facility] Pulmonary and Critical Care Sleep medicine

## 2022-01-25 ENCOUNTER — Other Ambulatory Visit: Payer: Self-pay

## 2022-01-25 MED ORDER — TRELEGY ELLIPTA 100-62.5-25 MCG/ACT IN AEPB: INHALATION_SPRAY | RESPIRATORY_TRACT | 1 refills | Status: DC

## 2022-02-06 ENCOUNTER — Ambulatory Visit (INDEPENDENT_AMBULATORY_CARE_PROVIDER_SITE_OTHER): Payer: Commercial Managed Care - PPO | Admitting: Internal Medicine

## 2022-02-06 DIAGNOSIS — J4520 Mild intermittent asthma, uncomplicated: Secondary | ICD-10-CM

## 2022-02-14 ENCOUNTER — Encounter: Payer: Self-pay | Admitting: Physician Assistant

## 2022-02-14 ENCOUNTER — Ambulatory Visit (INDEPENDENT_AMBULATORY_CARE_PROVIDER_SITE_OTHER): Payer: Commercial Managed Care - PPO | Admitting: Physician Assistant

## 2022-02-14 VITALS — BP 119/78 | HR 87 | Temp 98.2°F | Resp 16 | Ht 64.0 in | Wt 252.0 lb

## 2022-02-14 DIAGNOSIS — J4520 Mild intermittent asthma, uncomplicated: Secondary | ICD-10-CM | POA: Diagnosis not present

## 2022-02-14 DIAGNOSIS — Z6841 Body Mass Index (BMI) 40.0 and over, adult: Secondary | ICD-10-CM

## 2022-02-14 NOTE — Progress Notes (Signed)
[facility] [address] [place], [place] [address]  Pulmonary Sleep Medicine   Office Visit Note  Patient Name: Kimberly Valdez DOB: Aug 14, 1985 MRN [id number]  Date of Service: 02/19/2022  Complaints/HPI: Pt is here for routine pulmonary follow up. Normally takes [facility] and does well with this, but cost has prevented her from getting it the past month. She was trialed on trelegy and did not like how it made her feel. Prefers [employer]. She will look for coupon online. Thinks it wont be a problem next month getting it once she isnt paying tuition as well. Normally doesn't use albuterol at all. She changes filters in home and uses air purifier to help avoid exacerbations. Has lost some weight since last visit and is working on this. Has not had sleep study yet due to insurance not covering and may pay out of pocket in future once not paying tuition as well. PFT reviewed and showed mild obstructive lung disease.  ROS  General: (-) fever, (-) chills, (-) night sweats, (-) weakness Skin: (-) rashes, (-) itching,. Eyes: (-) visual changes, (-) redness, (-) itching. Nose and Sinuses: (-) nasal stuffiness or itchiness, (-) postnasal drip, (-) nosebleeds, (-) sinus trouble. Mouth and Throat: (-) sore throat, (-) hoarseness. Neck: (-) swollen glands, (-) enlarged thyroid, (-) neck pain. Respiratory: - cough, (-) bloody sputum, - shortness of breath, - wheezing. Cardiovascular: - ankle swelling, (-) chest pain. Lymphatic: (-) lymph node enlargement. Neurologic: (-) numbness, (-) tingling. Psychiatric: (-) anxiety, (-) depression   Current Medication: Outpatient Encounter Medications as of 02/14/2022  Medication Sig   albuterol (VENTOLIN HFA) 108 (90 Base) MCG/ACT inhaler Inhale 2 puffs into the lungs every 6 (six) hours as needed for wheezing or shortness of breath.   aspirin-acetaminophen-caffeine (EXCEDRIN MIGRAINE) 250-250-65 MG tablet Take 2 tablets by mouth every 6 (six)  hours as needed for headache.   ibuprofen (ADVIL) 200 MG tablet Take 200 mg by mouth every 6 (six) hours as needed.   mometasone-formoterol (DULERA) 100-5 MCG/ACT AERO Inhale 2 puffs into the lungs 2 (two) times daily.   [DISCONTINUED] Fluticasone-Umeclidin-Vilant (TRELEGY ELLIPTA) 100-62.5-25 MCG/ACT AEPB Inhale 1 puff into lungs once a day   No facility-administered encounter medications on file as of 02/14/2022.    Surgical History: Past Surgical History:  Procedure Laterality Date   DENTAL SURGERY  09/2018    Medical History: Past Medical History:  Diagnosis Date   Asthma    RAD (reactive airway disease)     Family History: Family History  Problem Relation Age of Onset   Hyperlipidemia Mother    Diabetes Father    Hyperlipidemia Father    Cirrhosis Father    Hypertension Father    Asthma Sister    Hernia Brother    Hyperlipidemia Maternal Grandmother    COPD Maternal Grandfather    Hyperlipidemia Maternal Grandfather    Heart disease Paternal Grandmother    Diabetes Paternal Grandmother    Hyperlipidemia Paternal Grandmother    Alzheimer's disease Paternal Grandmother    Heart disease Paternal Grandfather    Diabetes Paternal Grandfather     Social History: Social History   Socioeconomic History   Marital status: Married    Spouse name: Not on file   Number of children: Not on file   Years of education: Not on file   Highest education level: Not on file  Occupational History   Not on file  Tobacco Use   Smoking status: Never   Smokeless tobacco: Never  Vaping Use   Vaping Use: Never used  Substance and Sexual Activity   Alcohol use: Not Currently   Drug use: Never   Sexual activity: Yes    Partners: Male    Birth control/protection: Condom  Other Topics Concern   Not on file  Social History Narrative   Not on file   Social Determinants of Health   Financial Resource Strain: Not on file  Food Insecurity: Not on file  Transportation Needs: Not  on file  Physical Activity: Not on file  Stress: Not on file  Social Connections: Not on file  Intimate Partner Violence: Not At Risk (01/06/2020)   Humiliation, Afraid, Rape, and Kick questionnaire    Fear of Current or Ex-Partner: No    Emotionally Abused: No    Physically Abused: No    Sexually Abused: No    Vital Signs: Blood pressure 119/78, pulse 87, temperature 98.2 F (36.8 C), resp. rate 16, height 5\' 4"  (1.626 m), weight 252 lb (114.3 kg), SpO2 96 %.  Examination: General Appearance: The patient is well-developed, well-nourished, and in no distress. Skin: Gross inspection of skin unremarkable. Head: normocephalic, no gross deformities. Eyes: no gross deformities noted. ENT: ears appear grossly normal no exudates. Neck: Supple. No thyromegaly. No LAD. Respiratory: Lungs clear to auscultation bilaterally. Cardiovascular: Normal S1 and S2 without murmur or rub. Extremities: No cyanosis. pulses are equal. Neurologic: Alert and oriented. No involuntary movements.  LABS: Recent Results (from the past 2160 hour(s))  Pulmonary Function Test     Status: None   Collection Time: 02/18/22  4:20 PM  Result Value Ref Range   FEV1     FVC     FEV1/FVC     TLC     DLCO      Radiology: No results found.  No results found.  No results found.    Assessment and Plan: Patient Active Problem List   Diagnosis Date Noted   Obesity BMI=41.7 01/06/2020   Pulmonary embolism (HCC) with pneumonia 2011 01/06/2020   Headache, migraine--never dx'd by MD 01/27/2017    1. Chronic asthma, mild intermittent, uncomplicated Continue dulera daily  2. Morbid obesity with BMI of 40.0-44.9, adult (HCC) Obesity Counseling: Had a lengthy discussion regarding patients BMI and weight issues. Patient was instructed on portion control as well as increased activity. Also discussed caloric restrictions with trying to maintain intake less than 2000 Kcal. Discussions were made in accordance with the  5As of weight management. Simple actions such as not eating late and if able to, taking a walk is suggested.    General Counseling: I have discussed the findings of the evaluation and examination with 04-13-1983.  I have also discussed any further diagnostic evaluation thatmay be needed or ordered today. Luciel verbalizes understanding of the findings of todays visit. We also reviewed her medications today and discussed drug interactions and side effects including but not limited excessive drowsiness and altered mental states. We also discussed that there is always a risk not just to her but also people around her. she has been encouraged to call the office with any questions or concerns that should arise related to todays visit.  No orders of the defined types were placed in this encounter.    Time spent: 30  I have personally obtained a history, examined the patient, evaluated laboratory and imaging results, formulated the assessment and plan and placed orders. This patient was seen by Marijean Niemann, PA-C in collaboration with Dr. Lynn Ito as a  part of collaborative care agreement.     Allyne Gee, MD [facility] Pulmonary and Critical Care Sleep medicine

## 2022-02-14 NOTE — Patient Instructions (Signed)
Asthma, Adult ? ?Asthma is a long-term (chronic) condition that causes recurrent episodes in which the lower airways in the lungs become tight and narrow. The narrowing is caused by inflammation and tightening of the smooth muscle around the lower airways. ?Asthma episodes, also called asthma attacks or asthma flares, may cause coughing, making high-pitched whistling sounds when you breathe, most often when you breathe out (wheezing), shortness of breath, and chest pain. The airways may produce extra mucus caused by the inflammation and irritation. During an attack, it can be difficult to breathe. Asthma attacks can range from minor to life-threatening. ?Asthma cannot be cured, but medicines and lifestyle changes can help control it and treat acute attacks. It is important to keep your asthma well controlled so the condition does not interfere with your daily life. ?What are the causes? ?This condition is believed to be caused by inherited (genetic) and environmental factors, but its exact cause is not known. ?What can trigger an asthma attack? ?Many things can bring on an asthma attack or make symptoms worse. These triggers are different for every person. Common triggers include: ?Allergens and irritants like mold, dust, pet dander, cockroaches, pollen, air pollution, and chemical odors. ?Cigarette smoke. ?Weather changes and cold air. ?Stress and strong emotional responses such as crying or laughing hard. ?Certain medications such as aspirin or beta blockers. ?Infections and inflammatory conditions, such as the flu, a cold, pneumonia, or inflammation of the nasal membranes (rhinitis). ?Gastroesophageal reflux disease (GERD). ?What are the signs or symptoms? ?Symptoms may occur right after exposure to an asthma trigger or hours later and can vary by person. Common signs and symptoms include: ?Wheezing. ?Trouble breathing (shortness of breath). ?Excessive nighttime or early morning coughing. ?Chest  tightness. ?Tiredness (fatigue) with minimal activity. ?Difficulty talking in complete sentences. ?Poor exercise tolerance. ?How is this diagnosed? ?This condition is diagnosed based on: ?A physical exam and your medical history. ?Tests, which may include: ?Lung function studies to evaluate the flow of air in your lungs. ?Allergy tests. ?Imaging tests, such as X-rays. ?How is this treated? ?There is no cure, but symptoms can be controlled with proper treatment. Treatment usually involves: ?Identifying and avoiding your asthma triggers. ?Inhaled medicines. Two types are commonly used to treat asthma, depending on severity: ?Controller medicines. These help prevent asthma symptoms from occurring. They are taken every day. ?Fast-acting reliever or rescue medicines. These quickly relieve asthma symptoms. They are used as needed and provide short-term relief. ?Using other medicines, such as: ?Allergy medicines, such as antihistamines, if your asthma attacks are triggered by allergens. ?Immune medicines (immunomodulators). These are medicines that help control the immune system. ?Using supplemental oxygen. This is only needed during a severe episode. ?Creating an asthma action plan. An asthma action plan is a written plan for managing and treating your asthma attacks. This plan includes: ?A list of your asthma triggers and how to avoid them. ?Information about when medicines should be taken and when their dosage should be changed. ?Instructions about using a device called a peak flow meter. A peak flow meter measures how well the lungs are working and the severity of your asthma. It helps you monitor your condition. ?Follow these instructions at home: ?Take over-the-counter and prescription medicines only as told by your health care provider. ?Stay up to date on all vaccinations as recommended by your healthcare provider, including vaccines for the flu and pneumonia. ?Use a peak flow meter and keep track of your peak flow  readings. ?Understand and use your asthma   action plan to address any asthma flares. ?Do not smoke or allow anyone to smoke in your home. ?Contact a health care provider if: ?You have wheezing, shortness of breath, or a cough that is not responding to medicines. ?Your medicines are causing side effects, such as a rash, itching, swelling, or trouble breathing. ?You need to use a reliever medicine more than 2-3 times a week. ?Your peak flow reading is still at 50-79% of your personal best after following your action plan for 1 hour. ?You have a fever and shortness of breath. ?Get help right away if: ?You are getting worse and do not respond to treatment during an asthma attack. ?You are short of breath when at rest or when doing very little physical activity. ?You have difficulty eating, drinking, or talking. ?You have chest pain or tightness. ?You develop a fast heartbeat or palpitations. ?You have a bluish color to your lips or fingernails. ?You are light-headed or dizzy, or you faint. ?Your peak flow reading is less than 50% of your personal best. ?You feel too tired to breathe normally. ?These symptoms may be an emergency. Get help right away. Call 911. ?Do not wait to see if the symptoms will go away. ?Do not drive yourself to the hospital. ?Summary ?Asthma is a long-term (chronic) condition that causes recurrent episodes in which the airways become tight and narrow. Asthma episodes, also called asthma attacks or asthma flares, can cause coughing, wheezing, shortness of breath, and chest pain. ?Asthma cannot be cured, but medicines and lifestyle changes can help keep it well controlled and prevent asthma flares. ?Make sure you understand how to avoid triggers and how and when to use your medicines. ?Asthma attacks can range from minor to life-threatening. Get help right away if you have an asthma attack and do not respond to treatment with your usual rescue medicines. ?This information is not intended to replace  advice given to you by your health care provider. Make sure you discuss any questions you have with your health care provider. ?Document Revised: 04/04/2021 Document Reviewed: 03/26/2021 ?Elsevier Patient Education ? 2023 [employer]vier Inc. ? ?

## 2022-02-17 NOTE — Procedures (Signed)
[facility] [address] [place], [address]    Complete Pulmonary Function Testing Interpretation:  FINDINGS:  Forced vital capacity is normal FEV1 is 2.41 L which is 78% predicted and is mildly decreased.  FEV1 FVC ratio is decreased.  Postbronchodilator there is no significant change in FEV1.  Total lung capacity is increased residual volume is increased residual on total in past ratio is increased FRC is increased DLCO is increased  IMPRESSION:  This pulmonary function study is suggestive of a mild obstructive lung disease clinical correlation is recommended  Yevonne Pax, MD [facility] Pulmonary Critical Care Medicine Sleep Medicine

## 2022-02-18 LAB — PULMONARY FUNCTION TEST

## 2022-03-14 ENCOUNTER — Telehealth: Payer: Self-pay | Admitting: Internal Medicine

## 2022-03-14 NOTE — Telephone Encounter (Signed)
Patient scheduled for psg on 04/02/22 @ [facility].tat

## 2022-03-18 ENCOUNTER — Encounter: Payer: Self-pay | Admitting: Internal Medicine

## 2022-03-18 ENCOUNTER — Telehealth: Payer: Self-pay | Admitting: Internal Medicine

## 2022-03-18 NOTE — Telephone Encounter (Signed)
Faxed update request to Kimberly Valdez with [facility] 

## 2022-04-05 ENCOUNTER — Other Ambulatory Visit: Payer: Commercial Managed Care - PPO

## 2022-04-05 ENCOUNTER — Ambulatory Visit (LOCAL_COMMUNITY_HEALTH_CENTER): Payer: Commercial Managed Care - PPO

## 2022-04-05 DIAGNOSIS — Z111 Encounter for screening for respiratory tuberculosis: Secondary | ICD-10-CM

## 2022-04-05 NOTE — Progress Notes (Signed)
Patient seen in nurse clinic for tb skin test, and requested egg free flu vaccine.  Discussed that we didn't have egg free flu vaccine at this time.  Reviewed CDC recommendation for regular flu vaccine even with egg allergy.  Client declined today and will discuss flu need with Primary Care Provider.    Katherine Syme Shelda Pal, RN

## 2022-04-08 ENCOUNTER — Telehealth: Payer: Self-pay | Admitting: Internal Medicine

## 2022-04-08 ENCOUNTER — Ambulatory Visit (LOCAL_COMMUNITY_HEALTH_CENTER): Payer: Self-pay

## 2022-04-08 DIAGNOSIS — Z111 Encounter for screening for respiratory tuberculosis: Secondary | ICD-10-CM

## 2022-04-08 LAB — TB SKIN TEST
Induration: 0 mm
TB Skin Test: NEGATIVE

## 2022-04-08 NOTE — Telephone Encounter (Signed)
Home SS>>04/10/22-Kimberly Valdez

## 2022-04-09 ENCOUNTER — Ambulatory Visit
Admission: RE | Admit: 2022-04-09 | Discharge: 2022-04-09 | Disposition: A | Discharge status: Home / Self Care | Payer: Commercial Managed Care - PPO | Source: Ambulatory Visit | Attending: Nephrology | Admitting: Nephrology

## 2022-04-09 ENCOUNTER — Other Ambulatory Visit: Payer: Self-pay | Admitting: Nephrology

## 2022-04-09 ENCOUNTER — Ambulatory Visit
Admission: RE | Admit: 2022-04-09 | Discharge: 2022-04-09 | Disposition: A | Discharge status: Home / Self Care | Payer: Commercial Managed Care - PPO | Attending: Nephrology | Admitting: Nephrology

## 2022-04-09 DIAGNOSIS — R52 Pain, unspecified: Secondary | ICD-10-CM

## 2022-04-09 DIAGNOSIS — M25522 Pain in left elbow: Secondary | ICD-10-CM | POA: Diagnosis present

## 2022-04-10 ENCOUNTER — Encounter (INDEPENDENT_AMBULATORY_CARE_PROVIDER_SITE_OTHER): Payer: Commercial Managed Care - PPO | Admitting: Internal Medicine

## 2022-04-10 DIAGNOSIS — G4719 Other hypersomnia: Secondary | ICD-10-CM

## 2022-04-19 ENCOUNTER — Ambulatory Visit (LOCAL_COMMUNITY_HEALTH_CENTER): Payer: Commercial Managed Care - PPO

## 2022-04-19 DIAGNOSIS — Z23 Encounter for immunization: Secondary | ICD-10-CM

## 2022-04-19 DIAGNOSIS — Z7185 Encounter for immunization safety counseling: Secondary | ICD-10-CM

## 2022-04-19 DIAGNOSIS — Z111 Encounter for screening for respiratory tuberculosis: Secondary | ICD-10-CM

## 2022-04-19 NOTE — Progress Notes (Signed)
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  Pt explains she has noticed rash , itching, and "throat thickening" after past vaccines and PPD that starts 20 min after vaccines/ppd and lasts for a few hours. Denies trouble breathing during these episodes and did not seek medical attn. She has notified her PCP of this and he recommended that she take a Benadryl before vaccines and ppd. She reports she has taken a benadryl before this appt. Today.   Consult Dr Vertell Novak who gives ok for flu vaccine and PPD today and advises pt to seek immediate medical attn if needed if experiences difficulty breathing or any signs of anaphylactic reaction.   RN placed PPD and administered flu vaccine today. Discussed provider recommendations with pt and she is in agreement. Tolerated vaccine well today. Updated [facility] copy given. PPDR scheduled 04/22/2022, pt aware. Josie Saunders, RN

## 2022-04-22 ENCOUNTER — Ambulatory Visit (LOCAL_COMMUNITY_HEALTH_CENTER): Payer: Commercial Managed Care - PPO

## 2022-04-22 DIAGNOSIS — Z111 Encounter for screening for respiratory tuberculosis: Secondary | ICD-10-CM

## 2022-04-22 LAB — TB SKIN TEST
Induration: 0 mm
TB Skin Test: NEGATIVE

## 2022-04-23 NOTE — Procedures (Signed)
[facility]  Portable Polysomnogram Report Part 1 Phone: [phone] Fax: [phone]  Patient Name: Kimberly Valdez, Kimberly Valdez Recording Device: Tawana Scale  D.O.B.: [birth year] Acquisition Number: [id number]  Referring Physician: Freda Munro, MD Acquisition Date: 04/10/2022   History: The patient is a 36 years old female who was referred for evaluation of possible sleep apnea..  Medical History: asthma, headache, history of PE.  Medications: albuterol, Dulera.  PROCEDURE  The unattended portable polysomnogram was conducted on the night of 04/10/2022.  The following parameters were monitored: Nasal and oral airflow, and body position. Additionally, thoracic and abdominal movements were recorded by inductance plethysmography. Oxygen saturation (SpO2) and heart rate (ECG) was monitored using a pulse Oximeter.  The tracing was scored using 30 second epochs. Hypopneas were scored per AASM definition VIIID1.B (4% desaturation).   Description: The total recording time was 546.7 minutes. Sleep parameters are not recorded.  Respiratory monitoring demonstrated significant snoring across the night in all positions.  Supine sleep was not observed.There were a total of 106 apneas and hypopneas for a Respiratory Event Index of 11.8 apneas and hypopneas per hour of recording. The average duration of the respiratory events was 21.2 seconds with a maximum duration of 53.5 seconds. The respiratory events were associated with peripheral oxygen desaturations on the average to 94 %. The lowest oxygen desaturation associated with a respiratory event was 76 %. Additionally, the mean oxygen saturation was 94 %. The total duration of oxygen < 90% was 14.7 minutes and <80% was 0.4 minutes.   Cardiac monitoring- The average heart rate during the recording was 85.3 bpm.  Impression: This routine overnight portable polysomnogram demonstrated the presence of obstructive sleep apnea. Overall the  Respiratory Event Index was 11.8 apneas and hypopneas per hour of recording with the lowest desaturation to 76 %. The findings may be limited by the absence of supine sleep. The clustering of the respiratory events suggest possible REM-related apnea.  Recommendations:     A CPAP titration would be recommended due to the severity of the sleep apnea. Would recommend weight loss in a patient with a BMI of 43.6 lb/in2.  Yevonne Pax, MD [facility] Pulmonary Critical Care and Sleep Medicine Electronically reviewed and digitally signed   [facility]  Portable Polysomnogram Report Part 2 Phone: [phone] Fax: [phone]   Study Date: 04/10/2022  Patient Name: Kimberly Valdez, Kimberly Valdez Recording Device: Tawana Scale  Sex: F Height: 64.0 in.  D.O.B.: 1986/04/20 Weight: 254.0 lbs.  Age: 68 years B.M.I: 43.6 lb/in2   Times and Durations  Lights off clock time:  9:36:27 PM Total Recording Time (TRT): 546.7 minutes  Lights on clock time: 6:43:09 AM Time In Bed (TIB): 546.7 minutes   Summary  AHI 11.8 OAI 6.3 CAI 0.4 Lowest Desat 76  AHI is the number of apneas and hypopneas per hour. OAI is the number of obstructive apneas per hour. CAI is the number of central apneas per hour. Lowest Desat is the lowest blood oxygen level that lasted at least 2 seconds.  RESPIRATORY EVENTS   Index (#/hour) Total # of Events Mean duration  (sec) Max duration  (sec) # of Events by Position       Supine Prone Left Right Up  Central Apneas 0.4 4 13.6 17.5    2 0 2 0  Obstructive Apneas 6.3 57 20.4 53.5    41 0 7 9  Mixed Apneas 0.3 3 23.7 39.0    3 0  0 0  Hypopneas 4.7 42 22.8 49.0    38 0 4 0  Apneas + Hypopneas 11.8 106 21.2 53.5    84 0 13 9  Total 11.8 106 21.2 53.5    84 0 13 9  Time in Position    224.9 23.6 283.1 14.6  AHI in Position    22.5 0.0 2.8 67.5    Oximetry Summary   Dur. (min) % TIB  <90 % 14.7 2.7  <85 % 3.6 0.7  <80 % 0.4 0.1  <70 % 0.0 0.0  Total Dur (min) <  89 10.5 min  Average (%) 94  Total # of Desats 191  Desat Index (#/hour) 21.5  Desat Max (%) 15  Desat Max dur (sec) 61.0  Lowest SpO2 % during sleep 76  Duration of Min SpO2 (sec) 5    Heart Rate Stats  Mean HR during sleep (BPM)  Highest HR during sleep 109  (BPM)  Highest HR during TIB  109 (BPM)    Snoring Summary  Total Snoring Episodes 299  Total Duration with Snoring 73.5 minutes  Mean Duration of Snoring 14.7 seconds  Percentage of Snoring 13.6 %

## 2022-04-24 ENCOUNTER — Telehealth: Payer: Self-pay | Admitting: Internal Medicine

## 2022-04-24 NOTE — Telephone Encounter (Signed)
Patient unable to schedule CPAP titration  due to cost .tat

## 2022-05-14 ENCOUNTER — Encounter: Payer: Self-pay | Admitting: Internal Medicine

## 2022-05-15 ENCOUNTER — Other Ambulatory Visit: Payer: Self-pay

## 2022-05-15 DIAGNOSIS — Z8709 Personal history of other diseases of the respiratory system: Secondary | ICD-10-CM

## 2022-05-15 MED ORDER — DULERA 100-5 MCG/ACT IN AERO: 2.0000 | INHALATION_SPRAY | Freq: Two times a day (BID) | RESPIRATORY_TRACT | 3 refills | Status: DC

## 2022-08-15 ENCOUNTER — Ambulatory Visit: Payer: Commercial Managed Care - PPO | Admitting: Physician Assistant

## 2023-02-12 ENCOUNTER — Telehealth: Payer: Self-pay | Admitting: Internal Medicine

## 2023-02-12 NOTE — Telephone Encounter (Signed)
Left vm and sent mychart message to confirm 02/19/23 appointment[facility]

## 2023-02-19 ENCOUNTER — Ambulatory Visit: Payer: Commercial Managed Care - PPO | Admitting: Internal Medicine

## 2024-06-15 ENCOUNTER — Ambulatory Visit (INDEPENDENT_AMBULATORY_CARE_PROVIDER_SITE_OTHER): Admitting: Internal Medicine

## 2024-06-15 ENCOUNTER — Encounter: Payer: Self-pay | Admitting: Internal Medicine

## 2024-06-15 VITALS — BP 119/95 | HR 87 | Temp 98.0°F | Resp 16 | Ht 64.0 in | Wt 251.8 lb

## 2024-06-15 DIAGNOSIS — Z8709 Personal history of other diseases of the respiratory system: Secondary | ICD-10-CM

## 2024-06-15 DIAGNOSIS — J4520 Mild intermittent asthma, uncomplicated: Secondary | ICD-10-CM

## 2024-06-15 MED ORDER — ALBUTEROL SULFATE HFA 108 (90 BASE) MCG/ACT IN AERS: 2.0000 | INHALATION_SPRAY | Freq: Four times a day (QID) | RESPIRATORY_TRACT | 3 refills | Status: AC | PRN

## 2024-06-15 MED ORDER — BUDESONIDE-FORMOTEROL FUMARATE 80-4.5 MCG/ACT IN AERO: 2.0000 | INHALATION_SPRAY | Freq: Two times a day (BID) | RESPIRATORY_TRACT | 3 refills | Status: AC

## 2024-06-15 NOTE — Progress Notes (Signed)
 [facility] [address] [place], [place] [address]  Pulmonary Sleep Medicine   Office Visit Note  Patient Name: Kimberly Valdez DOB: [birth year] MRN [id number]  Date of Service: 06/15/2024  Complaints/HPI: She states she has not been able to get her dulera  due to unavailable. The patient states she has been using her rescue more frequently as a result. We will try her on symbicort . Patient is willing to try it if she can get the medication.   Office Spirometry Results:     ROS  General: (-) fever, (-) chills, (-) night sweats, (-) weakness Skin: (-) rashes, (-) itching,. Eyes: (-) visual changes, (-) redness, (-) itching. Nose and Sinuses: (-) nasal stuffiness or itchiness, (-) postnasal drip, (-) nosebleeds, (-) sinus trouble. Mouth and Throat: (-) sore throat, (-) hoarseness. Neck: (-) swollen glands, (-) enlarged thyroid, (-) neck pain. Respiratory: + cough, (-) bloody sputum, + shortness of breath, - wheezing. Cardiovascular: - ankle swelling, (-) chest pain. Lymphatic: (-) lymph node enlargement. Neurologic: (-) numbness, (-) tingling. Psychiatric: (-) anxiety, (-) depression   Current Medication: Outpatient Encounter Medications as of 06/15/2024  Medication Sig   albuterol  (VENTOLIN  HFA) 108 (90 Base) MCG/ACT inhaler Inhale 2 puffs into the lungs every 6 (six) hours as needed for wheezing or shortness of breath.   aspirin-acetaminophen-caffeine (EXCEDRIN MIGRAINE) 250-250-65 MG tablet Take 2 tablets by mouth every 6 (six) hours as needed for headache.   ibuprofen (ADVIL) 200 MG tablet Take 200 mg by mouth every 6 (six) hours as needed.   mometasone -formoterol  (DULERA ) 100-5 MCG/ACT AERO Inhale 2 puffs into the lungs 2 (two) times daily.   No facility-administered encounter medications on file as of 06/15/2024.    Surgical History: Past Surgical History:  Procedure Laterality Date   DENTAL SURGERY  09/2018    Medical History: Past Medical  History:  Diagnosis Date   Asthma    RAD (reactive airway disease)     Family History: Family History  Problem Relation Age of Onset   Hyperlipidemia Mother    Diabetes Father    Hyperlipidemia Father    Cirrhosis Father    Hypertension Father    Asthma Sister    Hernia Brother    Hyperlipidemia Maternal Grandmother    COPD Maternal Grandfather    Hyperlipidemia Maternal Grandfather    Heart disease Paternal Grandmother    Diabetes Paternal Grandmother    Hyperlipidemia Paternal Grandmother    Alzheimer's disease Paternal Grandmother    Heart disease Paternal Grandfather    Diabetes Paternal Grandfather     Social History: Social History   Socioeconomic History   Marital status: Married    Spouse name: Not on file   Number of children: Not on file   Years of education: Not on file   Highest education level: Not on file  Occupational History   Not on file  Tobacco Use   Smoking status: Never   Smokeless tobacco: Never  Vaping Use   Vaping status: Never Used  Substance and Sexual Activity   Alcohol use: Not Currently   Drug use: Never   Sexual activity: Yes    Partners: Male    Birth control/protection: Condom  Other Topics Concern   Not on file  Social History Narrative   Not on file   Social Drivers of Health   Tobacco Use: Low Risk (06/15/2024)   Patient History    Smoking Tobacco Use: Never    Smokeless Tobacco Use: Never  Passive Exposure: Not on file  Financial Resource Strain: Not on file  Food Insecurity: Not on file  Transportation Needs: Not on file  Physical Activity: Not on file  Stress: Not on file  Social Connections: Not on file  Intimate Partner Violence: Not on file  Depression (EYV7-0): Not on file  Alcohol Screen: Not on file  Housing: Not on file  Utilities: Not on file  Health Literacy: Not on file    Vital Signs: Blood pressure (!) 119/95, pulse 87, temperature 98 F (36.7 C), resp. rate 16, height 5' 4 (1.626 m),  weight 251 lb 12.8 oz (114.2 kg), SpO2 97%.  Examination: General Appearance: The patient is well-developed, well-nourished, and in no distress. Skin: Gross inspection of skin unremarkable. Head: normocephalic, no gross deformities. Eyes: no gross deformities noted. ENT: ears appear grossly normal no exudates. Neck: Supple. No thyromegaly. No LAD. Respiratory: no rhonchi noted. Cardiovascular: Normal S1 and S2 without murmur or rub. Extremities: No cyanosis. pulses are equal. Neurologic: Alert and oriented. No involuntary movements.  LABS: No results found for this or any previous visit (from the past 2160 hours).  Radiology: DG ELBOW COMPLETE LEFT (3+VIEW) Result Date: 04/10/2022 CLINICAL DATA:  History of fall.  Pain left elbow. EXAM: LEFT ELBOW - COMPLETE 3+ VIEW COMPARISON:  No prior. FINDINGS: Lucency noted about the distal humerus most consistent with a skin fold. No evidence of fracture or dislocation. Radial head is intact. No evidence of effusion. IMPRESSION: No acute bony abnormality identified. Electronically Signed   By: Debby  Register M.D.   On: 04/10/2022 08:36    No results found.  No results found.  Assessment and Plan: Patient Active Problem List   Diagnosis Date Noted   Obesity BMI=41.7 01/06/2020   Pulmonary embolism (HCC) with pneumonia 2011 01/06/2020   Headache, migraine--never dx'd by MD 01/27/2017   1. Chronic asthma, mild intermittent, uncomplicated (Primary) I will write her prescription for Symbicort  as Dulera  is not really available for her.  She will continue with using the Symbicort  for her asthma - budesonide -formoterol  (SYMBICORT ) 80-4.5 MCG/ACT inhaler; Inhale 2 puffs into the lungs 2 (two) times daily.  Dispense: 1 each; Refill: 3  2. Obesity, morbid (HCC) Obesity Counseling: Had a lengthy discussion regarding patients BMI and weight issues. Patient was instructed on portion control as well as increased activity.   3. History of asthma She  needs to be on the LABA steroid combination as discussed above rescue inhaler also used as needed - albuterol  (VENTOLIN  HFA) 108 (90 Base) MCG/ACT inhaler; Inhale 2 puffs into the lungs every 6 (six) hours as needed for wheezing or shortness of breath.  Dispense: 18 g; Refill: 3   General Counseling: I have discussed the findings of the evaluation and examination with Ami.  I have also discussed any further diagnostic evaluation thatmay be needed or ordered today. Rokia verbalizes understanding of the findings of todays visit. We also reviewed her medications today and discussed drug interactions and side effects including but not limited excessive drowsiness and altered mental states. We also discussed that there is always a risk not just to her but also people around her. she has been encouraged to call the office with any questions or concerns that should arise related to todays visit.  No orders of the defined types were placed in this encounter.    Time spent: 20  I have personally obtained a history, examined the patient, evaluated laboratory and imaging results, formulated the assessment and plan and  placed orders.    Elfreda DELENA Bathe, MD [facility] Pulmonary and Critical Care Sleep medicine

## 2024-06-15 NOTE — Patient Instructions (Signed)
 Asthma, Adult  Asthma is a condition that causes swelling and narrowing of the airways. These are the passages that lead from the nose and mouth down into the lungs. When asthma symptoms get worse it is called an asthma attack or flare. This can make it hard to breathe. Asthma flares can range from minor to life-threatening. There is no cure for asthma, but medicines and lifestyle changes can help to control it. What are the causes? It is not known exactly what causes asthma, but certain things can cause asthma symptoms to get worse (triggers). What can trigger an asthma attack? Cigarette smoke. Mold. Dust. Your pet's skin flakes (dander). Cockroaches. Pollen. Air pollution (like household cleaners, wood smoke, smog, or Therapist, occupational). What are the signs or symptoms? Trouble breathing (shortness of breath). Coughing. Making high-pitched whistling sounds when you breathe, most often when you breathe out (wheezing). Chest tightness. Tiredness with little activity. Poor exercise tolerance. How is this treated? Controller medicines that help prevent asthma symptoms. Fast-acting reliever or rescue medicines. These give short-term relief of asthma symptoms. Allergy medicines if your attacks are brought on by allergens. Medicines to help control the body's defense (immune) system. Staying away from the things that cause asthma attacks. Follow these instructions at home: Avoiding triggers in your home Do not allow anyone to smoke in your home. Limit use of fireplaces and wood stoves. Get rid of pests (such as roaches and mice) and their droppings. Keep your home clean. Clean your floors. Dust regularly. Use cleaning products that do not smell. Wash bed sheets and blankets every week in hot water. Dry them in a dryer. Have someone vacuum when you are not home. Change your heating and air conditioning filters often. Use blankets that are made of polyester or cotton. General  instructions Take over-the-counter and prescription medicines only as told by your doctor. Do not smoke or use any products that contain nicotine or tobacco. If you need help quitting, ask your doctor. Stay away from secondhand smoke. Avoid doing things outdoors when allergen counts are high and when air quality is low. Warm up before you exercise. Take time to cool down after exercise. Use a peak flow meter as told by your doctor. A peak flow meter is a tool that measures how well your lungs are working. Keep track of the peak flow meter's readings. Write them down. Follow your asthma action plan. This is a written plan for taking care of your asthma and treating your attacks. Make sure you get all the shots (vaccines) that your doctor recommends. Ask your doctor about a flu shot and a pneumonia shot. Keep all follow-up visits. Contact a doctor if: You have wheezing, shortness of breath, or a cough even while taking medicine to prevent attacks. The mucus you cough up (sputum) is thicker than usual. The mucus you cough up changes from clear or white to yellow, green, gray, or is bloody. You have problems from the medicine you are taking, such as: A rash. Itching. Swelling. Trouble breathing. You need reliever medicines more than 2-3 times a week. Your peak flow reading is still at 50-79% of your personal best after following the action plan for 1 hour. You have a fever. Get help right away if: You seem to be worse and are not responding to medicine during an asthma attack. You are short of breath even at rest. You get short of breath when doing very little activity. You have trouble eating, drinking, or talking. You have chest  pain or tightness. You have a fast heartbeat. Your lips or fingernails start to turn blue. You are light-headed or dizzy, or you faint. Your peak flow is less than 50% of your personal best. You feel too tired to breathe normally. These symptoms may be an  emergency. Get help right away. Call 911. Do not wait to see if the symptoms will go away. Do not drive yourself to the hospital. Summary Asthma is a long-term (chronic) condition in which the airways get tight and narrow. An asthma attack can make it hard to breathe. Asthma cannot be cured, but medicines and lifestyle changes can help control it. Make sure you understand how to avoid triggers and how and when to use your medicines. Avoid things that can cause allergy symptoms (allergens). These include animal skin flakes (dander) and pollen from trees or grass. Avoid things that pollute the air. These may include household cleaners, wood smoke, smog, or chemical odors. This information is not intended to replace advice given to you by your health care provider. Make sure you discuss any questions you have with your health care provider. Document Revised: 03/26/2021 Document Reviewed: 03/26/2021 [employer] Patient Education  2024 [employer].

## 2025-06-20 ENCOUNTER — Ambulatory Visit: Admitting: Internal Medicine
